# Patient Record
Sex: Female | Born: 1995
Health system: Southern US, Community
[De-identification: ages and names within clinical notes are randomized; demographics above are authoritative.]

## PROBLEM LIST (undated history)

## (undated) ENCOUNTER — Inpatient Hospital Stay (HOSPITAL_COMMUNITY): Payer: Self-pay

## (undated) DIAGNOSIS — L309 Dermatitis, unspecified: Secondary | ICD-10-CM

## (undated) DIAGNOSIS — F909 Attention-deficit hyperactivity disorder, unspecified type: Secondary | ICD-10-CM

## (undated) HISTORY — PX: NO PAST SURGERIES: SHX2092

## (undated) HISTORY — PX: INDUCED ABORTION: SHX677

---
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
## 2017-05-28 ENCOUNTER — Other Ambulatory Visit: Payer: Self-pay

## 2017-05-28 ENCOUNTER — Encounter (HOSPITAL_BASED_OUTPATIENT_CLINIC_OR_DEPARTMENT_OTHER): Payer: Self-pay | Admitting: Emergency Medicine

## 2017-05-28 ENCOUNTER — Emergency Department (HOSPITAL_BASED_OUTPATIENT_CLINIC_OR_DEPARTMENT_OTHER)
Admission: EM | Admit: 2017-05-28 | Discharge: 2017-05-28 | Disposition: A | Discharge status: Home / Self Care | Payer: 59 | Attending: Emergency Medicine | Admitting: Emergency Medicine

## 2017-05-28 ENCOUNTER — Emergency Department (HOSPITAL_BASED_OUTPATIENT_CLINIC_OR_DEPARTMENT_OTHER): Payer: 59

## 2017-05-28 DIAGNOSIS — S8992XA Unspecified injury of left lower leg, initial encounter: Secondary | ICD-10-CM | POA: Diagnosis not present

## 2017-05-28 DIAGNOSIS — Y9301 Activity, walking, marching and hiking: Secondary | ICD-10-CM | POA: Diagnosis not present

## 2017-05-28 DIAGNOSIS — Z79899 Other long term (current) drug therapy: Secondary | ICD-10-CM | POA: Insufficient documentation

## 2017-05-28 DIAGNOSIS — X509XXA Other and unspecified overexertion or strenuous movements or postures, initial encounter: Secondary | ICD-10-CM | POA: Diagnosis not present

## 2017-05-28 DIAGNOSIS — Y929 Unspecified place or not applicable: Secondary | ICD-10-CM | POA: Insufficient documentation

## 2017-05-28 DIAGNOSIS — S80912A Unspecified superficial injury of left knee, initial encounter: Secondary | ICD-10-CM | POA: Diagnosis present

## 2017-05-28 DIAGNOSIS — S83402A Sprain of unspecified collateral ligament of left knee, initial encounter: Secondary | ICD-10-CM | POA: Diagnosis not present

## 2017-05-28 DIAGNOSIS — Y999 Unspecified external cause status: Secondary | ICD-10-CM | POA: Insufficient documentation

## 2017-05-28 MED ORDER — IBUPROFEN 600 MG PO TABS: 600.0000 mg | ORAL_TABLET | Freq: Three times a day (TID) | ORAL | 0 refills | Status: DC | PRN

## 2017-05-28 MED ORDER — IBUPROFEN 800 MG PO TABS
800.0000 mg | ORAL_TABLET | Freq: Once | ORAL | Status: AC
  Administered 2017-05-28: 800 mg via ORAL
  Filled 2017-05-28: qty 1

## 2017-05-28 NOTE — ED Triage Notes (Signed)
Patient states that she is having pain to her left knee after a fall recently. The patient reports that swelling

## 2017-05-28 NOTE — ED Provider Notes (Signed)
[facility] EMERGENCY DEPARTMENT Provider Note   CSN: [id number] Arrival date & time: 05/28/17  1224     History   Chief Complaint Chief Complaint  Patient presents with  . Knee Pain    HPI Autumn Gibson is a 22 y.o. female.  HPI   22 year old female with history of ADHD presenting for evaluation of left knee injury.  Patient report today she Accidentally twisted her left knee while walking and experiencing pain to the back of her knee.  Pain is a sharp throbbing sensation worsening with she bending her knee.  She felt that her knee is not well aligned.  States that she injured the same knee several times in the past after a major fall and mentioned that has not been the same since.  Rates pain as 5 out of 10.  Pain does not radiate.  No hip or ankle pain.  She has been able to ambulate.  No new numbness.  No specific treatment tried.  Past Medical History:  Diagnosis Date  . ADHD (attention deficit hyperactivity disorder)     Patient Active Problem List   Diagnosis Date Noted  . SVD (spontaneous vaginal delivery) 10/01/2015  . Postpartum care following vaginal delivery 10/01/2015  . Term pregnancy 09/30/2015    Past Surgical History:  Procedure Laterality Date  . NO PAST SURGERIES      OB History    Gravida Para Term Preterm AB Living   1 1 1     1    SAB TAB Ectopic Multiple Live Births         0 1       Home Medications    Prior to Admission medications   Medication Sig Start Date End Date Taking? Authorizing Provider  ibuprofen (ADVIL,MOTRIN) 600 MG tablet Take 1 tablet (600 mg total) by mouth every 6 (six) hours. 10/03/15   Edwinna AreolaBanga, Cecilia Worema, DO  meloxicam (MOBIC) 15 MG tablet Take 1 tablet (15 mg total) by mouth daily. 09/26/16   Robinson, [place] N, PA-C  Prenatal Vit-Fe Fumarate-FA (PRENATAL MULTIVITAMIN) TABS tablet Take 1 tablet by mouth daily at 12 noon.    [provider]    Family History Family History  Problem Relation  Age of Onset  . Hypertension Father   . Hypertension Other     Social History Social History   Tobacco Use  . Smoking status: Never Smoker  . Smokeless tobacco: Never Used  Substance Use Topics  . Alcohol use: No  . Drug use: No     Allergies   Patient has no known allergies.   Review of Systems Review of Systems  Constitutional: Positive for fever.  Musculoskeletal: Positive for arthralgias.  Neurological: Negative for numbness.     Physical Exam Updated Vital Signs BP 108/75 (BP Location: Left Arm)   Pulse 77   Temp 98.4 F (36.9 C) (Oral)   Resp 18   Ht 5\' 3"  (1.6 m)   Wt 72.6 kg (160 lb)   LMP 05/26/2017   SpO2 100%   BMI 28.34 kg/m   Physical Exam  Constitutional: She appears well-developed and well-nourished. No distress.  HENT:  Head: Atraumatic.  Eyes: Conjunctivae are normal.  Neck: Neck supple.  Musculoskeletal: She exhibits tenderness (Left knee: Tenderness to posterior fossa on palpation.  Patella is located.  No swelling erythema, warmth or deformity noted.  No joint laxity.).  Left ankle and left hips are nontender  Neurological: She is alert.  Skin: No rash  noted.  Psychiatric: She has a normal mood and affect.  Nursing note and vitals reviewed.    ED Treatments / Results  Labs (all labs ordered are listed, but only abnormal results are displayed) Labs Reviewed - No data to display  EKG  EKG Interpretation None       Radiology Dg Knee Complete 4 Views Left  Result Date: 05/28/2017 CLINICAL DATA:  Larey Seat today with twisting injury. EXAM: LEFT KNEE - COMPLETE 4+ VIEW COMPARISON:  09/01/2016 FINDINGS: No visible joint effusion. No fracture or dislocation. No degenerative change or focal finding. IMPRESSION: Normal radiographs. Electronically Signed   By: Paulina Fusi M.D.   On: 05/28/2017 12:50    Procedures Procedures (including critical care time)  Medications Ordered in ED Medications - No data to display   Initial  Impression / Assessment and Plan / ED Course  I have reviewed the triage vital signs and the nursing notes.  Pertinent labs & imaging results that were available during my care of the patient were reviewed by me and considered in my medical decision making (see chart for details).     BP 108/75 (BP Location: Left Arm)   Pulse 77   Temp 98.4 F (36.9 C) (Oral)   Resp 18   Ht 5\' 3"  (1.6 m)   Wt 72.6 kg (160 lb)   LMP 05/26/2017   SpO2 100%   BMI 28.34 kg/m    Final Clinical Impressions(s) / ED Diagnoses   Final diagnoses:  Sprain of collateral ligament of left knee, initial encounter    ED Discharge Orders        Ordered    ibuprofen (ADVIL,MOTRIN) 600 MG tablet  Every 8 hours PRN     05/28/17 1412     2:12 PM Patient injured her left knee from a A mechanical fall.  X-ray of left knee without acute fractures or dislocation.  This is likely a knee sprain.  Will provide knee sleeve for support.  Rice therapy discussed.  Orthopedic referral given given as needed.   Fayrene Helper, PA-C 05/28/17 1413    Raeford Razor, MD 05/28/17 [id number]

## 2017-09-01 ENCOUNTER — Emergency Department (HOSPITAL_BASED_OUTPATIENT_CLINIC_OR_DEPARTMENT_OTHER)
Admission: EM | Admit: 2017-09-01 | Discharge: 2017-09-01 | Disposition: A | Discharge status: Home / Self Care | Payer: 59 | Attending: Emergency Medicine | Admitting: Emergency Medicine

## 2017-09-01 ENCOUNTER — Other Ambulatory Visit: Payer: Self-pay

## 2017-09-01 ENCOUNTER — Encounter (HOSPITAL_BASED_OUTPATIENT_CLINIC_OR_DEPARTMENT_OTHER): Payer: Self-pay | Admitting: Emergency Medicine

## 2017-09-01 DIAGNOSIS — Z79899 Other long term (current) drug therapy: Secondary | ICD-10-CM | POA: Insufficient documentation

## 2017-09-01 DIAGNOSIS — H9201 Otalgia, right ear: Secondary | ICD-10-CM | POA: Diagnosis present

## 2017-09-01 DIAGNOSIS — H6501 Acute serous otitis media, right ear: Secondary | ICD-10-CM | POA: Diagnosis not present

## 2017-09-01 DIAGNOSIS — H66001 Acute suppurative otitis media without spontaneous rupture of ear drum, right ear: Secondary | ICD-10-CM | POA: Diagnosis not present

## 2017-09-01 DIAGNOSIS — F909 Attention-deficit hyperactivity disorder, unspecified type: Secondary | ICD-10-CM | POA: Insufficient documentation

## 2017-09-01 MED ORDER — IBUPROFEN 400 MG PO TABS
600.0000 mg | ORAL_TABLET | Freq: Once | ORAL | Status: AC
  Administered 2017-09-01: 600 mg via ORAL
  Filled 2017-09-01: qty 1

## 2017-09-01 MED ORDER — AMOXICILLIN 500 MG PO CAPS: 500.0000 mg | ORAL_CAPSULE | Freq: Three times a day (TID) | ORAL | 0 refills | Status: AC

## 2017-09-01 MED ORDER — OXYCODONE-ACETAMINOPHEN 5-325 MG PO TABS
1.0000 | ORAL_TABLET | Freq: Once | ORAL | Status: AC
  Administered 2017-09-01: 1 via ORAL
  Filled 2017-09-01: qty 1

## 2017-09-01 MED ORDER — NAPROXEN 500 MG PO TABS: 500.0000 mg | ORAL_TABLET | Freq: Two times a day (BID) | ORAL | 0 refills | Status: DC | PRN

## 2017-09-01 MED FILL — AMOXICILLIN 500 MG CAPSULE: 500 | 7 days supply | Qty: 21 | Fill #0

## 2017-09-01 MED FILL — NAPROXEN 500 MG TABLET: 500 | 10 days supply | Qty: 20 | Fill #0

## 2017-09-01 NOTE — ED Notes (Signed)
ED Provider at bedside. 

## 2017-09-01 NOTE — ED Triage Notes (Signed)
Severe right ear pain since 3am.  Sore throat and sinus congestion for past few days.

## 2017-09-02 NOTE — ED Provider Notes (Signed)
[facility] EMERGENCY DEPARTMENT Provider Note   CSN: [id number] Arrival date & time: 09/01/17  0908     History   Chief Complaint Chief Complaint  Patient presents with  . Otalgia    HPI Autumn Gibson is a 22 y.o. female.  HPI  22 year old female with right ear pain.  Acute onset around 2 AM this morning.  Pain is sharp and persistent since then.  No change in hearing.  No drainage.  No fevers or chills.  No other acute complaints.  Past Medical History:  Diagnosis Date  . ADHD (attention deficit hyperactivity disorder)     Patient Active Problem List   Diagnosis Date Noted  . SVD (spontaneous vaginal delivery) 10/01/2015  . Postpartum care following vaginal delivery 10/01/2015  . Term pregnancy 09/30/2015    Past Surgical History:  Procedure Laterality Date  . NO PAST SURGERIES       OB History    Gravida  1   Para  1   Term  1   Preterm      AB      Living  1     SAB      TAB      Ectopic      Multiple  0   Live Births  1            Home Medications    Prior to Admission medications   Medication Sig Start Date End Date Taking? Authorizing Provider  amoxicillin (AMOXIL) 500 MG capsule Take 1 capsule (500 mg total) by mouth 3 (three) times daily. 09/01/17   Raeford RazorKohut, Dayden Viverette, MD  ibuprofen (ADVIL,MOTRIN) 600 MG tablet Take 1 tablet (600 mg total) by mouth every 8 (eight) hours as needed for mild pain or moderate pain. 05/28/17   Fayrene Helperran, Bow[place], PA-C  meloxicam (MOBIC) 15 MG tablet Take 1 tablet (15 mg total) by mouth daily. 09/26/16   Robinson, [place] N, PA-C  naproxen (NAPROSYN) 500 MG tablet Take 1 tablet (500 mg total) by mouth 2 (two) times daily as needed. 09/01/17   Raeford RazorKohut, Herbie Lehrmann, MD  Prenatal Vit-Fe Fumarate-FA (PRENATAL MULTIVITAMIN) TABS tablet Take 1 tablet by mouth daily at 12 noon.    [provider]    Family History Family History  Problem Relation Age of Onset  . Hypertension Father   . Hypertension Other       Social History Social History   Tobacco Use  . Smoking status: Never Smoker  . Smokeless tobacco: Never Used  Substance Use Topics  . Alcohol use: No  . Drug use: No     Allergies   Patient has no known allergies.   Review of Systems Review of Systems   All systems reviewed and negative, other than as noted in HPI.   Physical Exam Updated Vital Signs BP (!) 125/93 (BP Location: Right Arm)   Pulse 74   Temp 98 F (36.7 C) (Oral)   Resp 16   Ht 5\' 4"  (1.626 m)   Wt 81.6 kg (180 lb)   LMP 08/17/2017   SpO2 100%   BMI 30.90 kg/m   Physical Exam  Constitutional: She appears well-developed and well-nourished. No distress.  HENT:  Head: Normocephalic and atraumatic.  Right external auditory canal and external ear is normal in appearance.  Right tympanic membrane is bulging, dull, erythematous and bony landmarks are indistinct.  Eyes: Conjunctivae are normal. Right eye exhibits no discharge. Left eye exhibits no discharge.  Neck: Neck supple.  Cardiovascular: Normal rate, regular rhythm and normal heart sounds. Exam reveals no gallop and no friction rub.  No murmur heard. Pulmonary/Chest: Effort normal and breath sounds normal. No respiratory distress.  Abdominal: Soft. She exhibits no distension. There is no tenderness.  Musculoskeletal: She exhibits no edema or tenderness.  Neurological: She is alert.  Skin: Skin is warm and dry.  Psychiatric: She has a normal mood and affect. Her behavior is normal. Thought content normal.  Nursing note and vitals reviewed.    ED Treatments / Results  Labs (all labs ordered are listed, but only abnormal results are displayed) Labs Reviewed - No data to display  EKG None  Radiology No results found.  Procedures Procedures (including critical care time)  Medications Ordered in ED Medications  ibuprofen (ADVIL,MOTRIN) tablet 600 mg (600 mg Oral Given 09/01/17 0940)  oxyCODONE-acetaminophen (PERCOCET/ROXICET) 5-325  MG per tablet 1 tablet (1 tablet Oral Given 09/01/17 0940)     Initial Impression / Assessment and Plan / ED Course  I have reviewed the triage vital signs and the nursing notes.  Pertinent labs & imaging results that were available during my care of the patient were reviewed by me and considered in my medical decision making (see chart for details).     22 year old female with right ear pain.  Otitis media on exam.  Antibiotics.  Return precautions discussed.  Outpatient follow-up as needed otherwise.  As needed NSAIDs for pain.  Final Clinical Impressions(s) / ED Diagnoses   Final diagnoses:  Non-recurrent acute suppurative otitis media of right ear without spontaneous rupture of tympanic membrane    ED Discharge Orders        Ordered    amoxicillin (AMOXIL) 500 MG capsule  3 times daily     09/01/17 0928    naproxen (NAPROSYN) 500 MG tablet  2 times daily PRN     09/01/17 1610       Raeford Razor, MD 09/02/17 1005

## 2018-02-06 ENCOUNTER — Emergency Department (HOSPITAL_BASED_OUTPATIENT_CLINIC_OR_DEPARTMENT_OTHER)
Admission: EM | Admit: 2018-02-06 | Discharge: 2018-02-06 | Disposition: A | Discharge status: Home / Self Care | Payer: 59 | Attending: Emergency Medicine | Admitting: Emergency Medicine

## 2018-02-06 ENCOUNTER — Other Ambulatory Visit: Payer: Self-pay

## 2018-02-06 ENCOUNTER — Encounter (HOSPITAL_BASED_OUTPATIENT_CLINIC_OR_DEPARTMENT_OTHER): Payer: Self-pay

## 2018-02-06 DIAGNOSIS — L309 Dermatitis, unspecified: Secondary | ICD-10-CM | POA: Diagnosis not present

## 2018-02-06 DIAGNOSIS — Z79899 Other long term (current) drug therapy: Secondary | ICD-10-CM | POA: Diagnosis not present

## 2018-02-06 DIAGNOSIS — R21 Rash and other nonspecific skin eruption: Secondary | ICD-10-CM | POA: Diagnosis present

## 2018-02-06 HISTORY — DX: Dermatitis, unspecified: L30.9

## 2018-02-06 MED ORDER — PREDNISONE 50 MG PO TABS
60.0000 mg | ORAL_TABLET | Freq: Once | ORAL | Status: AC
  Administered 2018-02-06: 60 mg via ORAL
  Filled 2018-02-06: qty 1

## 2018-02-06 MED ORDER — DIPHENHYDRAMINE HCL 25 MG PO CAPS
25.0000 mg | ORAL_CAPSULE | Freq: Once | ORAL | Status: AC
  Administered 2018-02-06: 25 mg via ORAL
  Filled 2018-02-06: qty 1

## 2018-02-06 MED ORDER — PREDNISONE 50 MG PO TABS: 50.0000 mg | ORAL_TABLET | Freq: Every day | ORAL | 0 refills | Status: AC

## 2018-02-06 MED ORDER — FAMOTIDINE 20 MG PO TABS
20.0000 mg | ORAL_TABLET | Freq: Once | ORAL | Status: AC
  Administered 2018-02-06: 20 mg via ORAL
  Filled 2018-02-06: qty 1

## 2018-02-06 NOTE — ED Triage Notes (Signed)
C/o scattered rash x 2 days-NAD-steady gait 

## 2018-02-06 NOTE — Discharge Instructions (Signed)
This is likely a contact dermatitis.  You may use cool compresses over the area but avoid putting any creams over it, try and avoid scratching as much as possible.  Take prednisone for the next 5 days as directed, Benadryl and Pepcid as needed for itching.  If not improving please follow-up with your primary care doctor.  If you develop fevers or chills, pain around the rash or increasing redness, puslike drainage from the area or any other new or concerning symptoms return to the emergency department.

## 2018-02-06 NOTE — ED Provider Notes (Signed)
[facility] EMERGENCY DEPARTMENT Provider Note   CSN: [id number] Arrival date & time: 02/06/18  1731     History   Chief Complaint Chief Complaint  Patient presents with  . Rash    HPI Autumn Gibson is a 22 y.o. female.  Autumn Gibson is a 22 y.o. Female history of eczema and ADHD, who presents to the emergency department for evaluation of extremely pruritic rash noted over her abdomen, she also reports small patches on her legs and arms.  She reports this started about 2 days ago and has gotten progressively worse.  She reports the rash is extremely itchy and she has been scratching especially over her abdomen and caused a few breaks in her skin.  She denies any associated fevers or chills, no nausea or vomiting, no abdominal pain.  No associated facial swelling, swelling of the lips or tongue, no shortness of breath or throat closing sensation.  She denies any new household products, medications or foods.  She does report history of eczema and reports this feels like a worse version of that.  She has not taken anything prior to arrival or tried any creams to try and treat rash.  No other aggravating or alleviating factors.     Past Medical History:  Diagnosis Date  . ADHD (attention deficit hyperactivity disorder)   . Eczema     Patient Active Problem List   Diagnosis Date Noted  . SVD (spontaneous vaginal delivery) 10/01/2015  . Postpartum care following vaginal delivery 10/01/2015  . Term pregnancy 09/30/2015    Past Surgical History:  Procedure Laterality Date  . NO PAST SURGERIES       OB History    Gravida  1   Para  1   Term  1   Preterm      AB      Living  1     SAB      TAB      Ectopic      Multiple  0   Live Births  1            Home Medications    Prior to Admission medications   Medication Sig Start Date End Date Taking? Authorizing Provider  amoxicillin (AMOXIL) 500 MG capsule Take 1 capsule (500 mg total)  by mouth 3 (three) times daily. 09/01/17   Raeford Razor, MD  ibuprofen (ADVIL,MOTRIN) 600 MG tablet Take 1 tablet (600 mg total) by mouth every 8 (eight) hours as needed for mild pain or moderate pain. 05/28/17   Fayrene Helper, PA-C  meloxicam (MOBIC) 15 MG tablet Take 1 tablet (15 mg total) by mouth daily. 09/26/16   Robinson, [place] N, PA-C  naproxen (NAPROSYN) 500 MG tablet Take 1 tablet (500 mg total) by mouth 2 (two) times daily as needed. 09/01/17   Raeford Razor, MD  Prenatal Vit-Fe Fumarate-FA (PRENATAL MULTIVITAMIN) TABS tablet Take 1 tablet by mouth daily at 12 noon.    [provider]    Family History Family History  Problem Relation Age of Onset  . Hypertension Father   . Hypertension Other     Social History Social History   Tobacco Use  . Smoking status: Never Smoker  . Smokeless tobacco: Never Used  Substance Use Topics  . Alcohol use: No  . Drug use: No     Allergies   Patient has no known allergies.   Review of Systems Review of Systems  Constitutional: Negative for chills and fever.  HENT: Negative for drooling, facial swelling, sore throat, trouble swallowing and voice change.   Respiratory: Negative for chest tightness, shortness of breath, wheezing and stridor.   Gastrointestinal: Negative for abdominal pain, nausea and vomiting.  Skin: Positive for rash.     Physical Exam Updated Vital Signs BP (!) 128/101 (BP Location: Left Arm)   Pulse 95   Temp 98.1 F (36.7 C) (Oral)   Resp 18   Ht 5\' 3"  (1.6 m)   Wt 80.7 kg   LMP 02/04/2018   SpO2 100%   BMI 31.53 kg/m   Physical Exam  Constitutional: She appears well-developed and well-nourished. No distress.  HENT:  Head: Normocephalic and atraumatic.  Mouth/Throat: Oropharynx is clear and moist.  No facial swelling, no swelling of the lips or tongue, posterior oropharynx is clear.  Eyes: Right eye exhibits no discharge. Left eye exhibits no discharge.  Neck: Neck supple.  Cardiovascular:  Normal rate, regular rhythm, normal heart sounds and intact distal pulses.  Pulmonary/Chest: Effort normal and breath sounds normal. No respiratory distress.  Respirations equal and unlabored, patient able to speak in full sentences, lungs clear to auscultation bilaterally  Abdominal: Soft. Bowel sounds are normal. She exhibits no distension and no mass. There is no tenderness. There is no guarding.  Raised pruritic rash with few breaks in the skin noted over the central abdomen (please see photo below), abdomen is soft, nondistended, bowel sounds present throughout, nontender to palpation throughout  Neurological: She is alert. Coordination normal.  Skin: Skin is warm and dry. She is not diaphoretic.  Raised rash of the abdomen with a few scattered patches noted on the lower extremities as well, no vesicles, no pustules, no petechiae, no surrounding erythema, no purulent drainage, no evidence of cellulitis  Psychiatric: She has a normal mood and affect. Her behavior is normal.  Nursing note and vitals reviewed.      ED Treatments / Results  Labs (all labs ordered are listed, but only abnormal results are displayed) Labs Reviewed - No data to display  EKG None  Radiology No results found.  Procedures Procedures (including critical care time)  Medications Ordered in ED Medications - No data to display   Initial Impression / Assessment and Plan / ED Course  I have reviewed the triage vital signs and the nursing notes.  Pertinent labs & imaging results that were available during my care of the patient were reviewed by me and considered in my medical decision making (see chart for details).  Rash consistent with dermatitis. Patient denies any difficulty breathing or swallowing.  Pt has a patent airway without stridor and is handling secretions without difficulty; no angioedema. No blisters, no pustules, no warmth, no draining sinus tracts, no superficial abscesses, no bullous  impetigo, no vesicles, no desquamation, no target lesions with dusky purpura or a central bulla. Not tender to touch. No concern for superimposed infection. No concern for SJS, TEN, TSS, tick borne illness, syphilis or other life-threatening condition. Will discharge home with short course of steroids, pepcid and recommend Benadryl and pepcid as needed for pruritis.  Return precautions discussed, patient to follow-up with your PCP on Monday if rash not improving.  She expresses understanding and is in agreement with this plan.  Stable for discharge home at this time.    Final Clinical Impressions(s) / ED Diagnoses   Final diagnoses:  Dermatitis    ED Discharge Orders    None       Dartha Lodge, [place]  02/06/18 1827    Maia PlanLong, Joshua G, MD 02/07/18 1012

## 2018-03-29 IMAGING — CR DG ANKLE COMPLETE 3+V*L*
3 series · 3 of 3 positions shown · non-contrast
Comparison: None.

CLINICAL DATA: Pain after fall.

EXAM:
LEFT ANKLE COMPLETE - 3+ VIEW

[x ankle ap left]
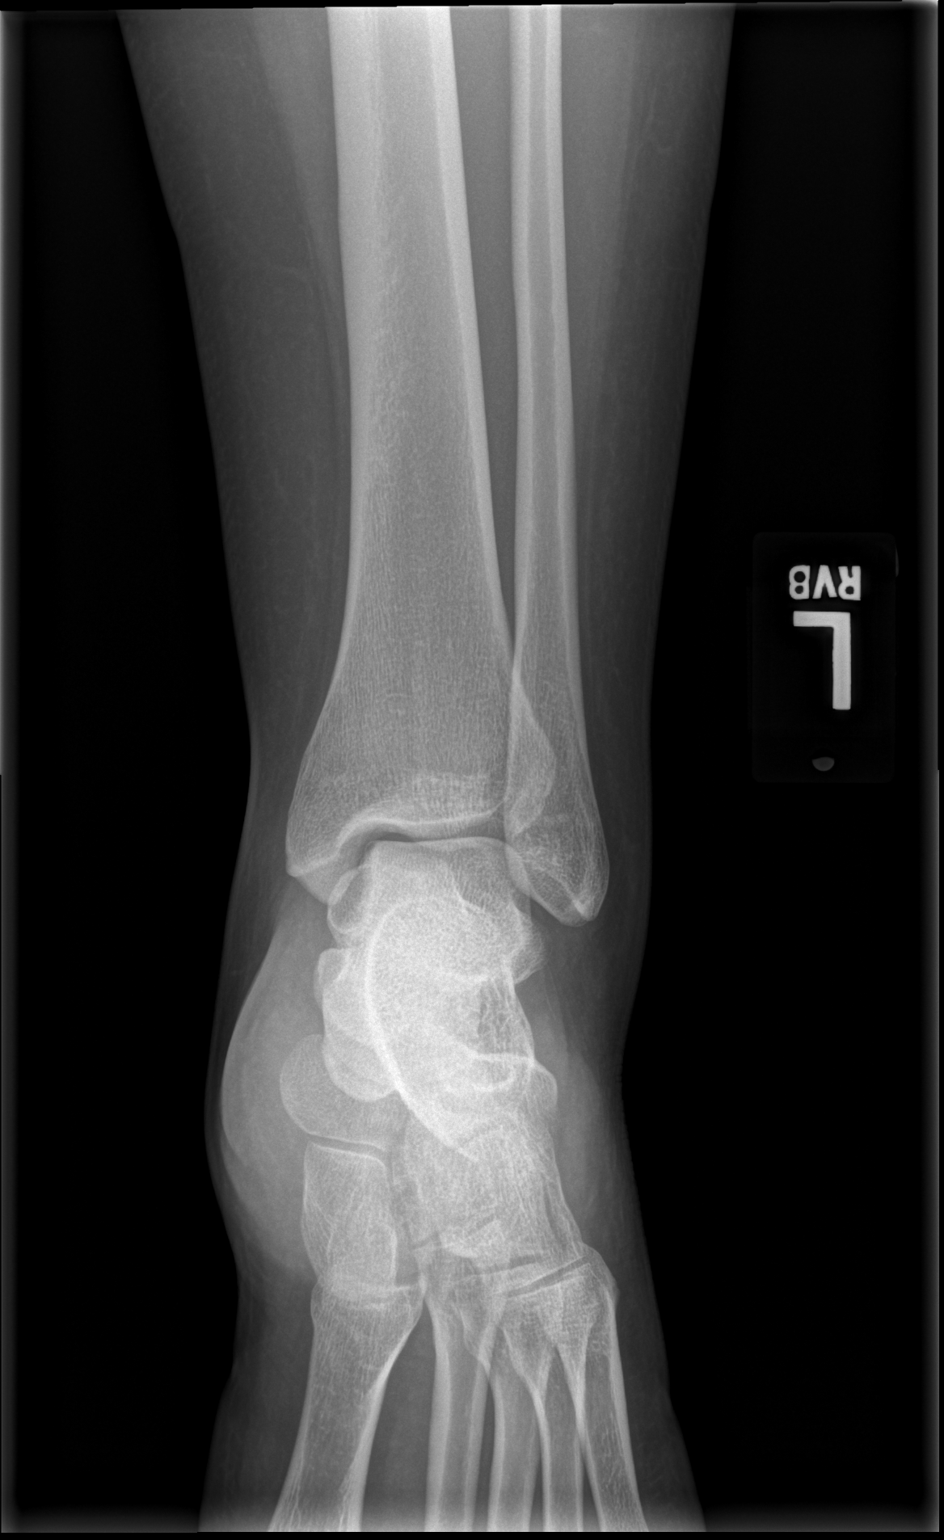

[x ankle obl left]
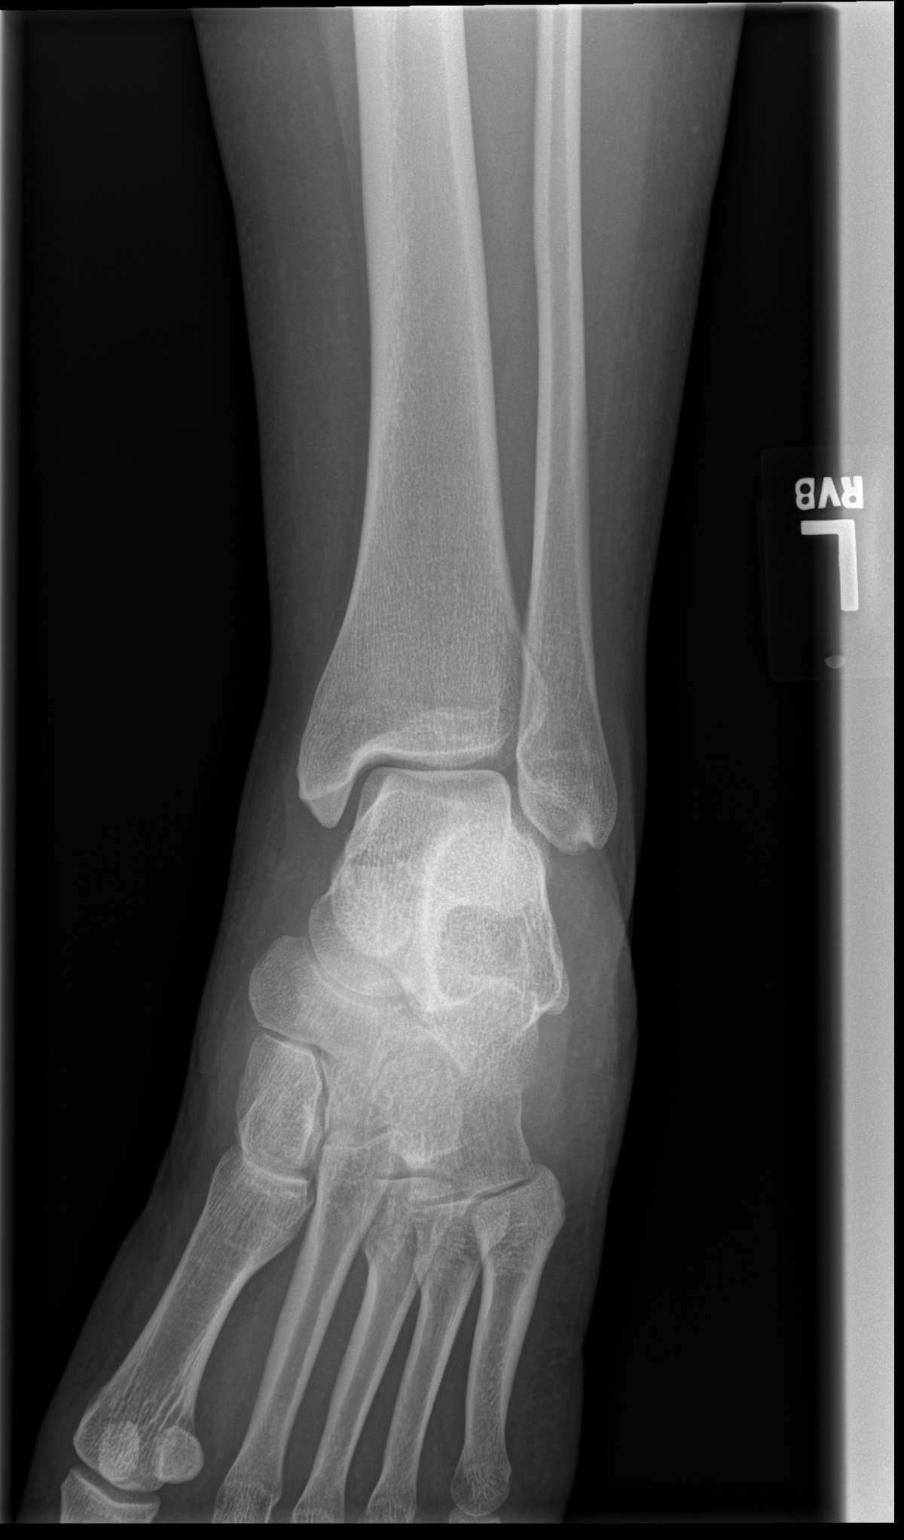

[x ankle lat left]
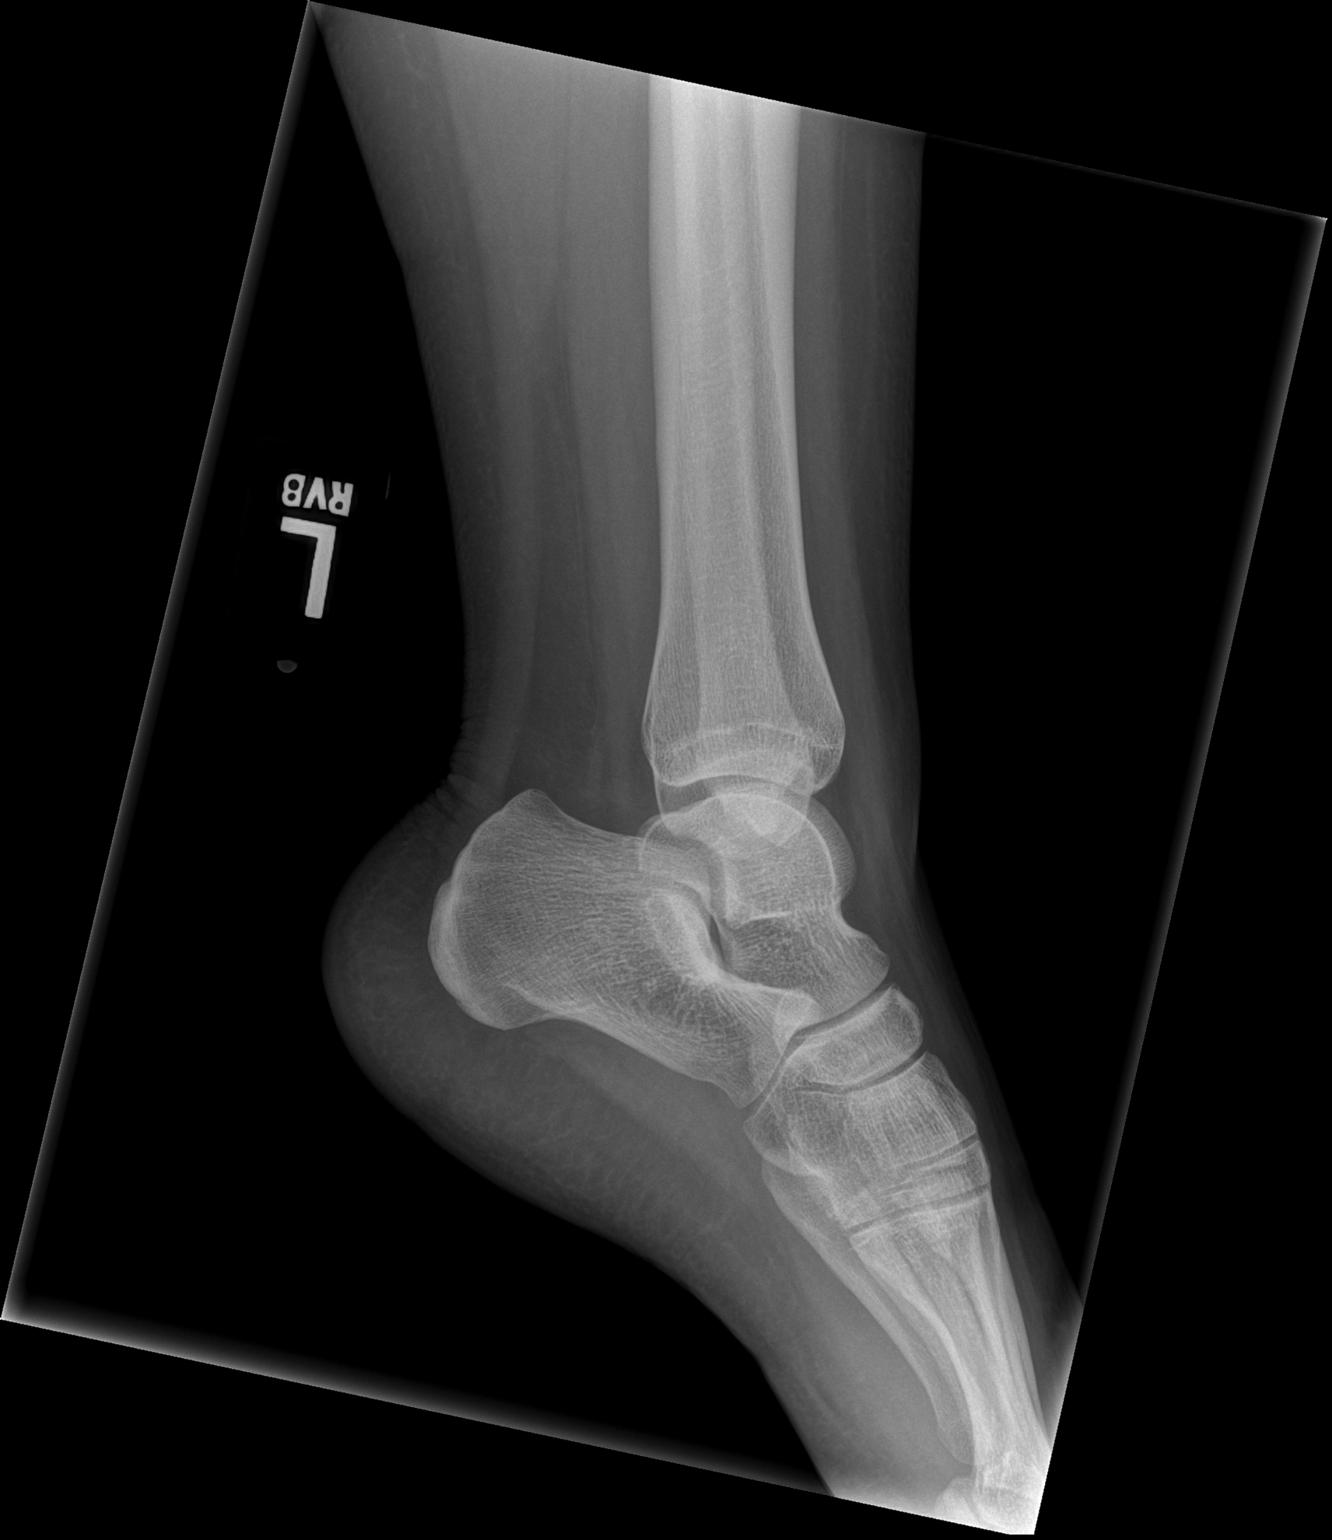

[3 of 3 positions shown; findings below may reference images not displayed]

FINDINGS: There is no evidence of fracture, dislocation, or joint effusion.
There is no evidence of arthropathy or other focal bone abnormality.
Soft tissues are unremarkable.
IMPRESSION: Negative.

## 2019-01-18 DIAGNOSIS — Z3046 Encounter for surveillance of implantable subdermal contraceptive: Secondary | ICD-10-CM | POA: Diagnosis not present

## 2019-01-18 DIAGNOSIS — Z113 Encounter for screening for infections with a predominantly sexual mode of transmission: Secondary | ICD-10-CM | POA: Diagnosis not present

## 2019-01-18 DIAGNOSIS — Z01419 Encounter for gynecological examination (general) (routine) without abnormal findings: Secondary | ICD-10-CM | POA: Diagnosis not present

## 2019-01-18 DIAGNOSIS — Z309 Encounter for contraceptive management, unspecified: Secondary | ICD-10-CM | POA: Diagnosis not present

## 2020-01-06 ENCOUNTER — Encounter (HOSPITAL_BASED_OUTPATIENT_CLINIC_OR_DEPARTMENT_OTHER): Payer: Self-pay

## 2020-01-06 ENCOUNTER — Other Ambulatory Visit: Payer: Self-pay

## 2020-01-06 ENCOUNTER — Emergency Department (HOSPITAL_BASED_OUTPATIENT_CLINIC_OR_DEPARTMENT_OTHER)
Admission: EM | Admit: 2020-01-06 | Discharge: 2020-01-06 | Disposition: A | Discharge status: Home / Self Care | Payer: 59 | Attending: Emergency Medicine | Admitting: Emergency Medicine

## 2020-01-06 DIAGNOSIS — B9689 Other specified bacterial agents as the cause of diseases classified elsewhere: Secondary | ICD-10-CM | POA: Diagnosis not present

## 2020-01-06 DIAGNOSIS — Z711 Person with feared health complaint in whom no diagnosis is made: Secondary | ICD-10-CM

## 2020-01-06 DIAGNOSIS — F909 Attention-deficit hyperactivity disorder, unspecified type: Secondary | ICD-10-CM | POA: Diagnosis not present

## 2020-01-06 DIAGNOSIS — Z202 Contact with and (suspected) exposure to infections with a predominantly sexual mode of transmission: Secondary | ICD-10-CM | POA: Insufficient documentation

## 2020-01-06 DIAGNOSIS — N760 Acute vaginitis: Secondary | ICD-10-CM | POA: Diagnosis not present

## 2020-01-06 LAB — WET PREP, GENITAL
Sperm: NONE SEEN
Trich, Wet Prep: NONE SEEN
Yeast Wet Prep HPF POC: NONE SEEN

## 2020-01-06 LAB — URINALYSIS, ROUTINE W REFLEX MICROSCOPIC
Bilirubin Urine: NEGATIVE
Glucose, UA: NEGATIVE mg/dL
Hgb urine dipstick: NEGATIVE
Ketones, ur: NEGATIVE mg/dL
Leukocytes,Ua: NEGATIVE
Nitrite: NEGATIVE
Protein, ur: NEGATIVE mg/dL
Specific Gravity, Urine: >1.03 — ABNORMAL HIGH (ref 1.005–1.030)
pH: 6 (ref 5.0–8.0)

## 2020-01-06 LAB — PREGNANCY, URINE: Preg Test, Ur: NEGATIVE

## 2020-01-06 MED ORDER — CEFTRIAXONE SODIUM 500 MG IJ SOLR
500.0000 mg | Freq: Once | INTRAMUSCULAR | Status: AC
  Administered 2020-01-06: 500 mg via INTRAMUSCULAR
  Filled 2020-01-06: qty 500

## 2020-01-06 MED ORDER — DOXYCYCLINE HYCLATE 100 MG PO CAPS: 100.0000 mg | ORAL_CAPSULE | Freq: Two times a day (BID) | ORAL | 0 refills | Status: AC

## 2020-01-06 MED ORDER — METRONIDAZOLE 500 MG PO TABS: 500.0000 mg | ORAL_TABLET | Freq: Two times a day (BID) | ORAL | 0 refills | Status: AC

## 2020-01-06 MED ORDER — LIDOCAINE HCL (PF) 1 % IJ SOLN
INTRAMUSCULAR | Status: AC
  Administered 2020-01-06: 5 mL
  Filled 2020-01-06: qty 5

## 2020-01-06 MED FILL — METRONIDAZOLE 500 MG TABS: 500 | 7 days supply | Qty: 14 | Fill #0

## 2020-01-06 MED FILL — DOXYCYCLINE HYCLATE 100 MG: 100 | 7 days supply | Qty: 14 | Fill #0

## 2020-01-06 NOTE — Discharge Instructions (Addendum)
I prescribed you 2 antibiotics.  The first is called metronidazole and the second is called doxycycline.  You are going to take these twice a day for the next 7 days.  Please do not stop taking these medications early.  Please return to the emergency department with new or worsening symptoms.  Please follow-up with your primary care provider in 1 week.  It was a pleasure to meet you.

## 2020-01-06 NOTE — ED Triage Notes (Signed)
Pt arrives with c/o vaginal d/c X2 weeks states she would also like to be tested for STD.

## 2020-01-06 NOTE — ED Provider Notes (Signed)
[facility] EMERGENCY DEPARTMENT Provider Note   CSN: [id number] Arrival date & time: 01/06/20  0825     History Chief Complaint  Patient presents with  . Vaginal Discharge    Autumn Gibson is a 24 y.o. female.  HPI Patient is a 24 year old female with a medical history as noted below.  Patient states about 2 weeks ago she started experiencing increased clear vaginal discharge.  She states that she and her boyfriend for the past 6 months recently broke up and she is concerned that he might have been sexually active with other females in addition to her.  She has no other complaints at this time.  No fever, chills, chest pain, shortness of breath, abdominal pain, nausea, vomiting, diarrhea, dysuria, hematuria.    Past Medical History:  Diagnosis Date  . ADHD (attention deficit hyperactivity disorder)   . Eczema     Patient Active Problem List   Diagnosis Date Noted  . SVD (spontaneous vaginal delivery) 10/01/2015  . Postpartum care following vaginal delivery 10/01/2015  . Term pregnancy 09/30/2015    Past Surgical History:  Procedure Laterality Date  . NO PAST SURGERIES       OB History    Gravida  1   Para  1   Term  1   Preterm      AB      Living  1     SAB      TAB      Ectopic      Multiple  0   Live Births  1           Family History  Problem Relation Age of Onset  . Hypertension Father   . Hypertension Other     Social History   Tobacco Use  . Smoking status: Never Smoker  . Smokeless tobacco: Never Used  Substance Use Topics  . Alcohol use: No  . Drug use: No    Home Medications Prior to Admission medications   Medication Sig Start Date End Date Taking? Authorizing Provider  amoxicillin (AMOXIL) 500 MG capsule Take 1 capsule (500 mg total) by mouth 3 (three) times daily. 09/01/17   Raeford Razor, MD  ibuprofen (ADVIL,MOTRIN) 600 MG tablet Take 1 tablet (600 mg total) by mouth every 8 (eight) hours as needed  for mild pain or moderate pain. 05/28/17   Fayrene Helper, PA-C  meloxicam (MOBIC) 15 MG tablet Take 1 tablet (15 mg total) by mouth daily. 09/26/16   Robinson, [place] N, PA-C  naproxen (NAPROSYN) 500 MG tablet Take 1 tablet (500 mg total) by mouth 2 (two) times daily as needed. 09/01/17   Raeford Razor, MD  Prenatal Vit-Fe Fumarate-FA (PRENATAL MULTIVITAMIN) TABS tablet Take 1 tablet by mouth daily at 12 noon.    [provider]    Allergies    Patient has no known allergies.  Review of Systems   Review of Systems  All other systems reviewed and are negative. Ten systems reviewed and are negative for acute change, except as noted in the HPI.    Physical Exam Updated Vital Signs BP 122/81 (BP Location: Right Arm)   Pulse 77   Temp 98.7 F (37.1 C) (Oral)   Resp 16   Ht 5\' 3"  (1.6 m)   Wt 75.8 kg   LMP 12/20/2019   SpO2 100%   BMI 29.58 kg/m   Physical Exam Vitals and nursing note reviewed.  Constitutional:      General: She is  not in acute distress.    Appearance: She is well-developed.  HENT:     Head: Normocephalic and atraumatic.     Right Ear: External ear normal.     Left Ear: External ear normal.  Eyes:     General: No scleral icterus.       Right eye: No discharge.        Left eye: No discharge.     Conjunctiva/sclera: Conjunctivae normal.  Neck:     Trachea: No tracheal deviation.  Cardiovascular:     Rate and Rhythm: Normal rate.  Pulmonary:     Effort: Pulmonary effort is normal. No respiratory distress.     Breath sounds: No stridor.  Abdominal:     General: There is no distension.  Genitourinary:    Comments: Female nursing chaperone present.  Normal-appearing vulvar anatomy.  Moderate amount of white discharge noted in the cervical vault.  Closed cervical os.  No CMT.  No adnexal tenderness. Musculoskeletal:        General: No swelling or deformity.     Cervical back: Neck supple.  Skin:    General: Skin is warm and dry.     Findings: No rash.    Neurological:     Mental Status: She is alert.     Cranial Nerves: Cranial nerve deficit: no gross deficits.    ED Results / Procedures / Treatments   Labs (all labs ordered are listed, but only abnormal results are displayed) Labs Reviewed  WET PREP, GENITAL - Abnormal; Notable for the following components:      Result Value   Clue Cells Wet Prep HPF POC PRESENT (*)    WBC, Wet Prep HPF POC MANY (*)    All other components within normal limits  URINALYSIS, ROUTINE W REFLEX MICROSCOPIC - Abnormal; Notable for the following components:   APPearance CLOUDY (*)    Specific Gravity, Urine >1.030 (*)    All other components within normal limits  PREGNANCY, URINE  GC/CHLAMYDIA PROBE AMP ([place]) NOT AT [facility]    EKG None  Radiology No results found.  Procedures Procedures   Medications Ordered in ED Medications  cefTRIAXone (ROCEPHIN) injection 500 mg (has no administration in time range)    ED Course  I have reviewed the triage vital signs and the nursing notes.  Pertinent labs & imaging results that were available during my care of the patient were reviewed by me and considered in my medical decision making (see chart for details).  Clinical Course as of Jan 05 1213  Thu Jan 06, 2020  1041 Preg Test, Ur: NEGATIVE [LJ]  1214 Clue Cells Wet Prep HPF POC(!): PRESENT [LJ]  1214 WBC, Wet Prep HPF POC(!): MANY [LJ]    Clinical Course User Index [LJ] Placido Sou, PA-C   MDM Rules/Calculators/A&P                          Pt is a 24 y.o. female that present with a history, physical exam, ED Clinical Course as noted above.   Patient presents today for STD screening.  Positive clue cells and white blood cells on wet prep.  Discussed treating gonorrhea and chlamydia prophylactically and patient is amenable.  Her pregnancy test is negative.  Will give a dose of Rocephin here in the emergency department.  Will discharge on 1 week of doxycycline as well as  Flagyl.  Recommended the patient abstain from sex for the next 2 weeks.  She understands to check the results of her gonorrhea and Chlamydia testing and if they are positive she needs to discuss with her previous sexual partner.  She understands she can return to the ED for new or worsening symptoms.  Recommended that she follow-up with her primary care provider.  Her questions were answered and she was amicable at the time of discharge.  Her vital signs are stable.  Patient discharged to home/self care.  Condition at discharge: Stable  Note: Portions of this report may have been transcribed using voice recognition software. Every effort was made to ensure accuracy; however, inadvertent computerized transcription errors may be present.    Final Clinical Impression(s) / ED Diagnoses Final diagnoses:  BV (bacterial vaginosis)  Concern about STD in female without diagnosis   Rx / DC Orders ED Discharge Orders         Ordered    doxycycline (VIBRAMYCIN) 100 MG capsule  2 times daily     Discontinue  Reprint     01/06/20 1342    metroNIDAZOLE (FLAGYL) 500 MG tablet  2 times daily     Discontinue  Reprint     01/06/20 1342           Placido Sou, PA-C 01/06/20 1346    Arby Barrette, MD 01/18/20 1524

## 2020-01-07 LAB — GC/CHLAMYDIA PROBE AMP (~~LOC~~) NOT AT ARMC
Chlamydia: NEGATIVE
Comment: NEGATIVE
Comment: NORMAL
Neisseria Gonorrhea: NEGATIVE

## 2020-05-25 ENCOUNTER — Other Ambulatory Visit: Payer: Self-pay

## 2020-05-25 ENCOUNTER — Encounter (HOSPITAL_BASED_OUTPATIENT_CLINIC_OR_DEPARTMENT_OTHER): Payer: Self-pay | Admitting: *Deleted

## 2020-05-25 ENCOUNTER — Emergency Department (HOSPITAL_BASED_OUTPATIENT_CLINIC_OR_DEPARTMENT_OTHER)
Admission: EM | Admit: 2020-05-25 | Discharge: 2020-05-25 | Disposition: A | Discharge status: Home / Self Care | Payer: 59 | Attending: Emergency Medicine | Admitting: Emergency Medicine

## 2020-05-25 DIAGNOSIS — N898 Other specified noninflammatory disorders of vagina: Secondary | ICD-10-CM | POA: Insufficient documentation

## 2020-05-25 DIAGNOSIS — Z5321 Procedure and treatment not carried out due to patient leaving prior to being seen by health care provider: Secondary | ICD-10-CM | POA: Diagnosis not present

## 2020-05-25 LAB — URINALYSIS, ROUTINE W REFLEX MICROSCOPIC
Bilirubin Urine: NEGATIVE
Glucose, UA: NEGATIVE mg/dL
Hgb urine dipstick: NEGATIVE
Ketones, ur: 80 mg/dL — AB
Leukocytes,Ua: NEGATIVE
Nitrite: NEGATIVE
Protein, ur: NEGATIVE mg/dL
Specific Gravity, Urine: 1.02 (ref 1.005–1.030)
pH: 7.5 (ref 5.0–8.0)

## 2020-05-25 LAB — PREGNANCY, URINE: Preg Test, Ur: NEGATIVE

## 2020-05-25 NOTE — ED Notes (Signed)
Per registration, pt leaving at this time. LWBS 

## 2020-05-25 NOTE — ED Triage Notes (Signed)
Vaginal discharge x 3 days

## 2020-06-08 ENCOUNTER — Emergency Department (HOSPITAL_BASED_OUTPATIENT_CLINIC_OR_DEPARTMENT_OTHER)
Admission: EM | Admit: 2020-06-08 | Discharge: 2020-06-08 | Disposition: A | Discharge status: Home / Self Care | Payer: 59 | Attending: Emergency Medicine | Admitting: Emergency Medicine

## 2020-06-08 ENCOUNTER — Other Ambulatory Visit (HOSPITAL_BASED_OUTPATIENT_CLINIC_OR_DEPARTMENT_OTHER): Payer: Self-pay | Admitting: Emergency Medicine

## 2020-06-08 ENCOUNTER — Other Ambulatory Visit: Payer: Self-pay

## 2020-06-08 ENCOUNTER — Encounter (HOSPITAL_BASED_OUTPATIENT_CLINIC_OR_DEPARTMENT_OTHER): Payer: Self-pay

## 2020-06-08 DIAGNOSIS — R059 Cough, unspecified: Secondary | ICD-10-CM | POA: Diagnosis present

## 2020-06-08 DIAGNOSIS — Z20822 Contact with and (suspected) exposure to covid-19: Secondary | ICD-10-CM | POA: Insufficient documentation

## 2020-06-08 DIAGNOSIS — B349 Viral infection, unspecified: Secondary | ICD-10-CM | POA: Diagnosis not present

## 2020-06-08 DIAGNOSIS — Z03818 Encounter for observation for suspected exposure to other biological agents ruled out: Secondary | ICD-10-CM | POA: Diagnosis not present

## 2020-06-08 LAB — SARS CORONAVIRUS 2 (TAT 6-24 HRS): SARS Coronavirus 2: NEGATIVE

## 2020-06-08 MED ORDER — BENZONATATE 100 MG PO CAPS
100.0000 mg | ORAL_CAPSULE | Freq: Three times a day (TID) | ORAL | 0 refills | Status: DC
Start: 2020-06-08 — End: 2020-06-08

## 2020-06-08 MED ORDER — FLUTICASONE PROPIONATE 50 MCG/ACT NA SUSP
2.0000 | Freq: Every day | NASAL | 0 refills | Status: DC
Start: 2020-06-08 — End: 2020-06-08

## 2020-06-08 MED ORDER — ONDANSETRON 4 MG PO TBDP
4.0000 mg | ORAL_TABLET | Freq: Three times a day (TID) | ORAL | 0 refills | Status: DC | PRN
Start: 2020-06-08 — End: 2020-06-08

## 2020-06-08 MED ORDER — PROMETHAZINE-DM 6.25-15 MG/5ML PO SYRP
5.0000 mL | ORAL_SOLUTION | Freq: Four times a day (QID) | ORAL | 0 refills | Status: DC | PRN
Start: 2020-06-08 — End: 2020-06-08

## 2020-06-08 MED FILL — PROMETHAZINE W/DM SYRUP: 6.25-15 | 10 days supply | Qty: 118 | Fill #0

## 2020-06-08 MED FILL — BENZONATATE 100 MG CAPS: 100 | 7 days supply | Qty: 21 | Fill #0

## 2020-06-08 MED FILL — ONDANSETRON ODT 4 MG TABLET: 4 | 6 days supply | Qty: 20 | Fill #0

## 2020-06-08 MED FILL — FLUTICASONE PROP 50 MCG SPR: 50 | 30 days supply | Qty: 16 | Fill #0

## 2020-06-08 NOTE — ED Triage Notes (Signed)
Pt reports sore throat and nasal congestion x2-3 days. Pt also reports headache.

## 2020-06-08 NOTE — ED Provider Notes (Signed)
[facility] EMERGENCY DEPARTMENT Provider Note   CSN: [id number] Arrival date & time: 06/08/20  0350     History Chief Complaint  Patient presents with  . Sore Throat  . Nasal Congestion    Autumn Gibson is a 25 y.o. female.  HPI Patient is a well-appearing 24 year old female presenting today with myalgias, congestion, sore throat, mild cough.  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  She states she is vaccinated.  Has not had her booster yet.  She denies any chest pain shortness of breath.   Patient is states that her symptoms have been present for 2 or 3 days they have been not worsening constant with no aggravating mitigating factors.  She has not taken any Tylenol or ibuprofen.  She has used some Mucinex with mild relief.   No other associated symptoms apart from some loose stool which she describes over the past 2 days.    Past Medical History:  Diagnosis Date  . ADHD (attention deficit hyperactivity disorder)   . Eczema     Patient Active Problem List   Diagnosis Date Noted  . SVD (spontaneous vaginal delivery) 10/01/2015  . Postpartum care following vaginal delivery 10/01/2015  . Term pregnancy 09/30/2015    Past Surgical History:  Procedure Laterality Date  . NO PAST SURGERIES       OB History    Gravida  1   Para  1   Term  1   Preterm      AB      Living  1     SAB      IAB      Ectopic      Multiple  0   Live Births  1           Family History  Problem Relation Age of Onset  . Hypertension Father   . Hypertension Other     Social History   Tobacco Use  . Smoking status: Never Smoker  . Smokeless tobacco: Never Used  Substance Use Topics  . Alcohol use: Yes    Comment: occ  . Drug use: No    Home Medications Prior to Admission medications   Medication Sig Start Date End Date Taking? Authorizing Provider  benzonatate (TESSALON) 100 MG capsule Take 1 capsule (100  mg total) by mouth every 8 (eight) hours. 06/08/20  Yes Arvie Villarruel S, PA  fluticasone (FLONASE) 50 MCG/ACT nasal spray Place 2 sprays into both nostrils daily for 14 days. 06/08/20 06/22/20 Yes Rasheena Talmadge S, PA  ondansetron (ZOFRAN ODT) 4 MG disintegrating tablet Take 1 tablet (4 mg total) by mouth every 8 (eight) hours as needed for nausea or vomiting. 06/08/20  Yes Kaveon Blatz S, PA  promethazine-dextromethorphan (PROMETHAZINE-DM) 6.25-15 MG/5ML syrup Take 5 mLs by mouth 4 (four) times daily as needed for cough. 06/08/20  Yes Milen Lengacher S, PA  amoxicillin (AMOXIL) 500 MG capsule Take 1 capsule (500 mg total) by mouth 3 (three) times daily. 09/01/17   Raeford Razor, MD  meloxicam (MOBIC) 15 MG tablet Take 1 tablet (15 mg total) by mouth daily. 09/26/16   Robinson, [place] N, PA-C  Prenatal Vit-Fe Fumarate-FA (PRENATAL MULTIVITAMIN) TABS tablet Take 1 tablet by mouth daily at 12 noon.    [provider]    Allergies    Patient has no known allergies.  Review of Systems   Review of Systems  Constitutional: Positive for fatigue. Negative for  chills and fever.  HENT: Positive for congestion, postnasal drip, rhinorrhea and sore throat.   Eyes: Negative for redness.  Respiratory: Positive for cough. Negative for shortness of breath.   Cardiovascular: Negative for chest pain and leg swelling.  Gastrointestinal: Positive for diarrhea (few loose stools). Negative for abdominal pain, nausea and vomiting.  Endocrine: Negative for polyphagia.  Genitourinary: Negative for dysuria.  Musculoskeletal: Positive for myalgias.  Skin: Negative for rash.  Neurological: Positive for headaches. Negative for syncope.  Psychiatric/Behavioral: Negative for confusion.    Physical Exam Updated Vital Signs BP 115/78 (BP Location: Left Arm)   Pulse (!) 103   Temp 98.5 F (36.9 C) (Oral)   Resp 16   Ht 5\' 3"  (1.6 m)   Wt 73.5 kg   LMP 05/18/2020   SpO2 99%   BMI 28.70 kg/m   Physical  Exam Vitals and nursing note reviewed.  Constitutional:      General: She is not in acute distress.    Comments: Pleasant well-appearing 25 year old.  In no acute distress.  Sitting comfortably in bed.  Able answer questions appropriately follow commands. No increased work of breathing. Speaking in full sentences.  HENT:     Head: Normocephalic and atraumatic.     Nose: Nose normal.     Mouth/Throat:     Mouth: Mucous membranes are moist.     Comments: Cobblestoning and PND Eyes:     General: No scleral icterus. Cardiovascular:     Rate and Rhythm: Normal rate and regular rhythm.     Pulses: Normal pulses.     Heart sounds: Normal heart sounds.  Pulmonary:     Effort: Pulmonary effort is normal. No respiratory distress.     Breath sounds: No wheezing.     Comments: Lungs CTAB No tachypnea or increased WOB Abdominal:     Palpations: Abdomen is soft.     Tenderness: There is no abdominal tenderness.  Musculoskeletal:     Cervical back: Normal range of motion.     Right lower leg: No edema.     Left lower leg: No edema.  Skin:    General: Skin is warm and dry.     Capillary Refill: Capillary refill takes less than 2 seconds.  Neurological:     Mental Status: She is alert. Mental status is at baseline.  Psychiatric:        Mood and Affect: Mood normal.        Behavior: Behavior normal.     ED Results / Procedures / Treatments   Labs (all labs ordered are listed, but only abnormal results are displayed) Labs Reviewed  SARS CORONAVIRUS 2 (TAT 6-24 HRS)    EKG None  Radiology No results found.  Procedures Procedures (including critical care time)  Medications Ordered in ED Medications - No data to display  ED Course  I have reviewed the triage vital signs and the nursing notes.  Pertinent labs & imaging results that were available during my care of the patient were reviewed by me and considered in my medical decision making (see chart for details).    MDM  Rules/Calculators/A&P                          Patient is a well-appearing 25 year old female presenting today with myalgias, congestion, sore throat, mild cough.  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  She states she is vaccinated.  Has  not had her booster yet.  She denies any chest pain shortness of breath.  Physical exam is notable for well-appearing 25 year old female in no acute distress vital signs within normal limits she was noted to have mild tachycardia in triage this is resolved at time of my evaluation.  Lungs are clear to auscultation all fields.  Posterior pharynx clear apart from PND.  Patient on strict return precautions discharged with conservative therapy and symptomatic treatment.  Autumn Gibson was evaluated in Emergency Department on 06/08/2020 for the symptoms described in the history of present illness. She was evaluated in the context of the global COVID-19 pandemic, which necessitated consideration that the patient might be at risk for infection with the SARS-CoV-2 virus that causes COVID-19. Institutional protocols and algorithms that pertain to the evaluation of patients at risk for COVID-19 are in a state of rapid change based on information released by regulatory bodies including the CDC and federal and state organizations. These policies and algorithms were followed during the patient's care in the ED.  Final Clinical Impression(s) / ED Diagnoses Final diagnoses:  Suspected COVID-19 virus infection  Viral syndrome    Rx / DC Orders ED Discharge Orders         Ordered    benzonatate (TESSALON) 100 MG capsule  Every 8 hours        06/08/20 1026    fluticasone (FLONASE) 50 MCG/ACT nasal spray  Daily        06/08/20 1026    ondansetron (ZOFRAN ODT) 4 MG disintegrating tablet  Every 8 hours PRN        06/08/20 1026    promethazine-dextromethorphan (PROMETHAZINE-DM) 6.25-15 MG/5ML syrup  4 times daily PRN        06/08/20  1026           Solon Augusta [place], [place] 06/08/20 1032    Pollyann Savoy, MD 06/08/20 1054

## 2020-06-08 NOTE — Discharge Instructions (Addendum)
Your COVID test is pending; you should expect results within 24 hours. You can access your results on your MyChart--if you test positive you should receive a phone call.  In the meantime follow CDC guidelines and quarantine, wear a mask, wash hands often.   Please use Tylenol or ibuprofen for pain.  You may use 600 mg ibuprofen every 6 hours or 1000 mg of Tylenol every 6 hours.  You may choose to alternate between the 2.  This would be most effective.  Not to exceed 4 g of Tylenol within 24 hours.  Not to exceed 3200 mg ibuprofen 24 hours.   Please take over the counter vitamin D 2000-4000 units per day. I also recommend zinc 50 mg per day for the next two weeks.   Please return to ED if you feel have difficulty breathing or have emergent, new or concerning symptoms.  Patients who have symptoms consistent with COVID-19 should self isolated for: At least 3 days (72 hours) have passed since recovery, defined as resolution of fever without the use of fever reducing medications and improvement in respiratory symptoms (e.g., cough, shortness of breath), and At least 7 days have passed since symptoms first appeared.       Person Under Monitoring Name: Autumn Gibson  Location: [address] [place] [address]   Infection Prevention Recommendations for Individuals Confirmed to have, or Being Evaluated for, 2019 Novel Coronavirus (COVID-19) Infection Who Receive Care at Home  Individuals who are confirmed to have, or are being evaluated for, COVID-19 should follow the prevention steps below until a healthcare provider or local or state health department says they can return to normal activities.  Stay home except to get medical care You should restrict activities outside your home, except for getting medical care. Do not go to work, school, or public areas, and do not use public transportation or taxis.  Call ahead before visiting your doctor Before your medical appointment,  call the healthcare provider and tell them that you have, or are being evaluated for, COVID-19 infection. This will help the healthcare providers office take steps to keep other people from getting infected. Ask your healthcare provider to call the local or state health department.  Monitor your symptoms Seek prompt medical attention if your illness is worsening (e.g., difficulty breathing). Before going to your medical appointment, call the healthcare provider and tell them that you have, or are being evaluated for, COVID-19 infection. Ask your healthcare provider to call the local or state health department.  Wear a facemask You should wear a facemask that covers your nose and mouth when you are in the same room with other people and when you visit a healthcare provider. People who live with or visit you should also wear a facemask while they are in the same room with you.  Separate yourself from other people in your home As much as possible, you should stay in a different room from other people in your home. Also, you should use a separate bathroom, if available.  Avoid sharing household items You should not share dishes, drinking glasses, cups, eating utensils, towels, bedding, or other items with other people in your home. After using these items, you should wash them thoroughly with soap and water.  Cover your coughs and sneezes Cover your mouth and nose with a tissue when you cough or sneeze, or you can cough or sneeze into your sleeve. Throw used tissues in a lined trash can, and immediately wash your  hands with soap and water for at least 20 seconds or use an alcohol-based hand rub.  Wash your [employer] your hands often and thoroughly with soap and water for at least 20 seconds. You can use an alcohol-based hand sanitizer if soap and water are not available and if your hands are not visibly dirty. Avoid touching your eyes, nose, and mouth with unwashed hands.   Prevention  Steps for Caregivers and Household Members of Individuals Confirmed to have, or Being Evaluated for, COVID-19 Infection Being Cared for in the Home  If you live with, or provide care at home for, a person confirmed to have, or being evaluated for, COVID-19 infection please follow these guidelines to prevent infection:  Follow healthcare providers instructions Make sure that you understand and can help the patient follow any healthcare provider instructions for all care.  Provide for the patients basic needs You should help the patient with basic needs in the home and provide support for getting groceries, prescriptions, and other personal needs.  Monitor the patients symptoms If they are getting sicker, call his or her medical provider and tell them that the patient has, or is being evaluated for, COVID-19 infection. This will help the healthcare providers office take steps to keep other people from getting infected. Ask the healthcare provider to call the local or state health department.  Limit the number of people who have contact with the patient If possible, have only one caregiver for the patient. Other household members should stay in another home or place of residence. If this is not possible, they should stay in another room, or be separated from the patient as much as possible. Use a separate bathroom, if available. Restrict visitors who do not have an essential need to be in the home.  Keep older adults, very young children, and other sick people away from the patient Keep older adults, very young children, and those who have compromised immune systems or chronic health conditions away from the patient. This includes people with chronic heart, lung, or kidney conditions, diabetes, and cancer.  Ensure good ventilation Make sure that shared spaces in the home have good air flow, such as from an air conditioner or an opened window, weather permitting.  Wash your hands  often Wash your hands often and thoroughly with soap and water for at least 20 seconds. You can use an alcohol based hand sanitizer if soap and water are not available and if your hands are not visibly dirty. Avoid touching your eyes, nose, and mouth with unwashed hands. Use disposable paper towels to dry your hands. If not available, use dedicated cloth towels and replace them when they become wet.  Wear a facemask and gloves Wear a disposable facemask at all times in the room and gloves when you touch or have contact with the patients blood, body fluids, and/or secretions or excretions, such as sweat, saliva, sputum, nasal mucus, vomit, urine, or feces.  Ensure the mask fits over your nose and mouth tightly, and do not touch it during use. Throw out disposable facemasks and gloves after using them. Do not reuse. Wash your hands immediately after removing your facemask and gloves. If your personal clothing becomes contaminated, carefully remove clothing and launder. Wash your hands after handling contaminated clothing. Place all used disposable facemasks, gloves, and other waste in a lined container before disposing them with other household waste. Remove gloves and wash your hands immediately after handling these items.  Do not share dishes, glasses,  or other household items with the patient Avoid sharing household items. You should not share dishes, drinking glasses, cups, eating utensils, towels, bedding, or other items with a patient who is confirmed to have, or being evaluated for, COVID-19 infection. After the person uses these items, you should wash them thoroughly with soap and water.  Wash laundry thoroughly Immediately remove and wash clothes or bedding that have blood, body fluids, and/or secretions or excretions, such as sweat, saliva, sputum, nasal mucus, vomit, urine, or feces, on them. Wear gloves when handling laundry from the patient. Read and follow directions on labels of  laundry or clothing items and detergent. In general, wash and dry with the warmest temperatures recommended on the label.  Clean all areas the individual has used often Clean all touchable surfaces, such as counters, tabletops, doorknobs, bathroom fixtures, toilets, phones, keyboards, tablets, and bedside tables, every day. Also, clean any surfaces that may have blood, body fluids, and/or secretions or excretions on them. Wear gloves when cleaning surfaces the patient has come in contact with. Use a diluted bleach solution (e.g., dilute bleach with 1 part bleach and 10 parts water) or a household disinfectant with a label that says EPA-registered for coronaviruses. To make a bleach solution at home, add 1 tablespoon of bleach to 1 quart (4 cups) of water. For a larger supply, add  cup of bleach to 1 gallon (16 cups) of water. Read labels of cleaning products and follow recommendations provided on product labels. Labels contain instructions for safe and effective use of the cleaning product including precautions you should take when applying the product, such as wearing gloves or eye protection and making sure you have good ventilation during use of the product. Remove gloves and wash hands immediately after cleaning.  Monitor yourself for signs and symptoms of illness Caregivers and household members are considered close contacts, should monitor their health, and will be asked to limit movement outside of the home to the extent possible. Follow the monitoring steps for close contacts listed on the symptom monitoring form.   ? If you have additional questions, contact your local health department or call the epidemiologist on call at [phone] (available 24/7). ? This guidance is subject to change. For the most up-to-date guidance from [facility], please refer to their website: [email or web address]

## 2020-06-27 ENCOUNTER — Emergency Department (HOSPITAL_COMMUNITY): Admission: EM | Admit: 2020-06-27 | Discharge: 2020-06-27 | Discharge status: Against Medical Advice | Payer: 59

## 2020-06-30 DIAGNOSIS — N760 Acute vaginitis: Secondary | ICD-10-CM | POA: Diagnosis not present

## 2020-08-01 DIAGNOSIS — N760 Acute vaginitis: Secondary | ICD-10-CM | POA: Diagnosis not present

## 2020-08-01 DIAGNOSIS — Z113 Encounter for screening for infections with a predominantly sexual mode of transmission: Secondary | ICD-10-CM | POA: Diagnosis not present

## 2020-08-01 DIAGNOSIS — N898 Other specified noninflammatory disorders of vagina: Secondary | ICD-10-CM | POA: Diagnosis not present

## 2020-08-24 DIAGNOSIS — N760 Acute vaginitis: Secondary | ICD-10-CM | POA: Diagnosis not present

## 2020-08-24 DIAGNOSIS — Z8742 Personal history of other diseases of the female genital tract: Secondary | ICD-10-CM | POA: Diagnosis not present

## 2020-08-24 DIAGNOSIS — N898 Other specified noninflammatory disorders of vagina: Secondary | ICD-10-CM | POA: Diagnosis not present

## 2021-01-02 DIAGNOSIS — Z113 Encounter for screening for infections with a predominantly sexual mode of transmission: Secondary | ICD-10-CM | POA: Diagnosis not present

## 2021-01-02 DIAGNOSIS — N760 Acute vaginitis: Secondary | ICD-10-CM | POA: Diagnosis not present

## 2021-01-02 DIAGNOSIS — N898 Other specified noninflammatory disorders of vagina: Secondary | ICD-10-CM | POA: Diagnosis not present

## 2021-02-18 ENCOUNTER — Emergency Department (HOSPITAL_BASED_OUTPATIENT_CLINIC_OR_DEPARTMENT_OTHER)
Admission: EM | Admit: 2021-02-18 | Discharge: 2021-02-18 | Disposition: A | Discharge status: Home / Self Care | Payer: 59 | Attending: Emergency Medicine | Admitting: Emergency Medicine

## 2021-02-18 ENCOUNTER — Encounter (HOSPITAL_BASED_OUTPATIENT_CLINIC_OR_DEPARTMENT_OTHER): Payer: Self-pay

## 2021-02-18 ENCOUNTER — Other Ambulatory Visit: Payer: Self-pay

## 2021-02-18 DIAGNOSIS — N898 Other specified noninflammatory disorders of vagina: Secondary | ICD-10-CM | POA: Insufficient documentation

## 2021-02-18 DIAGNOSIS — N760 Acute vaginitis: Secondary | ICD-10-CM | POA: Diagnosis not present

## 2021-02-18 DIAGNOSIS — B9689 Other specified bacterial agents as the cause of diseases classified elsewhere: Secondary | ICD-10-CM | POA: Diagnosis not present

## 2021-02-18 LAB — URINALYSIS, MICROSCOPIC (REFLEX)

## 2021-02-18 LAB — URINALYSIS, ROUTINE W REFLEX MICROSCOPIC
Bilirubin Urine: NEGATIVE
Glucose, UA: NEGATIVE mg/dL
Hgb urine dipstick: NEGATIVE
Ketones, ur: NEGATIVE mg/dL
Nitrite: NEGATIVE
Protein, ur: NEGATIVE mg/dL
Specific Gravity, Urine: 1.015 (ref 1.005–1.030)
pH: 6 (ref 5.0–8.0)

## 2021-02-18 LAB — PREGNANCY, URINE: Preg Test, Ur: NEGATIVE

## 2021-02-18 LAB — WET PREP, GENITAL
Clue Cells Wet Prep HPF POC: NONE SEEN
Sperm: NONE SEEN
Trich, Wet Prep: NONE SEEN
Yeast Wet Prep HPF POC: NONE SEEN

## 2021-02-18 MED ORDER — FLUCONAZOLE 150 MG PO TABS: 150.0000 mg | ORAL_TABLET | Freq: Every day | ORAL | 0 refills | Status: AC

## 2021-02-18 MED ORDER — METRONIDAZOLE 500 MG PO TABS
500.0000 mg | ORAL_TABLET | Freq: Two times a day (BID) | ORAL | 0 refills | Status: DC
Start: 2021-02-18 — End: 2021-06-23

## 2021-02-18 NOTE — ED Provider Notes (Signed)
[facility] EMERGENCY DEPARTMENT Provider Note   CSN: [id number] Arrival date & time: 02/18/21  1914     History Chief Complaint  Patient presents with   Vaginal Odor    Autumn Gibson is a 25 y.o. female past medical history of medical termination of pregnancy presenting today with a complaint of vaginal odor.  Patient reports that 2 weeks ago she had a medical abortion and that she bled until Monday and after she stopped bleeding she noticed a vaginal odor.  She says that this smells like previous occasions of bacterial vaginosis.  She has used boric acid for the symptoms in the past however reports that she feels that she needs antibiotics.  Did not have discharge until this morning which she described as white.  No itching or pain.  Sexually active with barrier protection.  No concern for STDs.  No urinary symptoms.  Past Medical History:  Diagnosis Date   ADHD (attention deficit hyperactivity disorder)    Eczema     Patient Active Problem List   Diagnosis Date Noted   SVD (spontaneous vaginal delivery) 10/01/2015   Postpartum care following vaginal delivery 10/01/2015   Term pregnancy 09/30/2015    Past Surgical History:  Procedure Laterality Date   INDUCED ABORTION     NO PAST SURGERIES       OB History     Gravida  1   Para  1   Term  1   Preterm      AB      Living  1      SAB      IAB      Ectopic      Multiple  0   Live Births  1           Family History  Problem Relation Age of Onset   Hypertension Father    Hypertension Other     Social History   Tobacco Use   Smoking status: Never   Smokeless tobacco: Never  Vaping Use   Vaping Use: Never used  Substance Use Topics   Alcohol use: Yes    Comment: occ   Drug use: No    Home Medications Prior to Admission medications   Medication Sig Start Date End Date Taking? Authorizing Provider  amoxicillin (AMOXIL) 500 MG capsule Take 1 capsule (500 mg total) by mouth  3 (three) times daily. 09/01/17   Raeford Razor, MD  benzonatate (TESSALON) 100 MG capsule TAKE 1 CAPSULE (100 MG TOTAL) BY MOUTH EVERY 8 (EIGHT) HOURS. 06/08/20 06/08/21  Fondaw, Stevphen Meuse S, PA  fluticasone (FLONASE) 50 MCG/ACT nasal spray PLACE 2 SPRAYS INTO BOTH NOSTRILS DAILY FOR 14 DAYS. 06/08/20 06/08/21  Gailen Shelter, PA  meloxicam (MOBIC) 15 MG tablet Take 1 tablet (15 mg total) by mouth daily. 09/26/16   Robinson, [place] N, PA-C  ondansetron (ZOFRAN-ODT) 4 MG disintegrating tablet TAKE 1 TABLET (4 MG TOTAL) BY MOUTH EVERY 8 (EIGHT) HOURS AS NEEDED FOR NAUSEA OR VOMITING. 06/08/20 06/08/21  Gailen Shelter, PA  Prenatal Vit-Fe Fumarate-FA (PRENATAL MULTIVITAMIN) TABS tablet Take 1 tablet by mouth daily at 12 noon.    [provider]  promethazine-dextromethorphan (PROMETHAZINE-DM) 6.25-15 MG/5ML syrup TAKE 5 MLS BY MOUTH 4 (FOUR) TIMES DAILY AS NEEDED FOR COUGH. 06/08/20 06/08/21  Gailen Shelter, PA    Allergies    Patient has no known allergies.  Review of Systems   Review of Systems  Constitutional:  Negative for chills and  fever.  Gastrointestinal:  Negative for abdominal pain, nausea and vomiting.  Genitourinary:  Positive for vaginal discharge. Negative for dysuria, frequency, urgency, vaginal bleeding and vaginal pain.  Skin:  Negative for rash and wound.  All other systems reviewed and are negative.  Physical Exam Updated Vital Signs BP 110/76   Pulse 70   Temp 98.6 F (37 C) (Oral)   Resp 18   Ht 5\' 3"  (1.6 m)   Wt 72.6 kg   SpO2 99%   BMI 28.34 kg/m   Physical Exam Vitals and nursing note reviewed.  Constitutional:      Appearance: Normal appearance.  HENT:     Head: Normocephalic and atraumatic.     Mouth/Throat:     Mouth: Mucous membranes are moist.     Pharynx: Oropharynx is clear.  Eyes:     General: No scleral icterus.    Conjunctiva/sclera: Conjunctivae normal.  Pulmonary:     Effort: Pulmonary effort is normal. No respiratory distress.   Abdominal:     General: Abdomen is flat.     Tenderness: There is no abdominal tenderness.  Genitourinary:    General: Normal vulva.     Vagina: Vaginal discharge present.     Comments: Patient with large amounts of watery discharge coming from the cervix.  The cervix without abnormalities.  No lesions or masses in the vaginal vault and no masses palpated on bimanual exam. Skin:    General: Skin is warm and dry.     Findings: No lesion or rash.  Neurological:     Mental Status: She is alert.  Psychiatric:        Mood and Affect: Mood normal.    ED Results / Procedures / Treatments   Labs (all labs ordered are listed, but only abnormal results are displayed) Labs Reviewed  WET PREP, GENITAL - Abnormal; Notable for the following components:      Result Value   WBC, Wet Prep HPF POC MANY (*)    All other components within normal limits  URINALYSIS, ROUTINE W REFLEX MICROSCOPIC - Abnormal; Notable for the following components:   Leukocytes,Ua SMALL (*)    All other components within normal limits  URINALYSIS, MICROSCOPIC (REFLEX) - Abnormal; Notable for the following components:   Bacteria, UA MANY (*)    All other components within normal limits  PREGNANCY, URINE  GC/CHLAMYDIA PROBE AMP ([place]) NOT AT [facility]    EKG None  Radiology No results found.  Procedures Procedures   Medications Ordered in ED Medications - No data to display  ED Course  I have reviewed the triage vital signs and the nursing notes.  Pertinent labs & imaging results that were available during my care of the patient were reviewed by me and considered in my medical decision making (see chart for details).   MDM Rules/Calculators/A&P Autumn Gibson is a 25 y.o. female past medical history of medical termination of pregnancy presenting today with a complaint of vaginal odor.  Patient reports that 2 weeks ago she had a medical abortion and that she bled until Monday and after she stopped  bleeding she noticed a vaginal odor.  She says that this smells like previous occasions of bacterial vaginosis.  She has used boric acid for the symptoms in the past however reports that she feels that she needs antibiotics.  Did not have discharge until this morning which she described as white.  No itching or pain.  Sexually active with barrier protection.  No  concern for STDs.   Patient was evaluated by me.  In no acute distress.  I performed a pelvic exam with RN as chaperone.  She tolerated this well.  Physical exam was consistent with bacterial vaginosis.  No cervical motion tenderness or concern for PID at this time.  Wet prep revealed white blood cells however no clue cells.  No yeast infection.  Because of my physical exam and patient's symptoms I will treat her for bacterial vaginosis.  I discussed the test results as well as my treatment plan with the patient.  Urine pregnancy negative.  Urinalysis positive for bacteria.  Also with leukocytes.  Possible contamination from large amounts of vaginal discharge.  No urinary symptoms so I will not treat for urinary tract infection.  I will send metronidazole to the patient's pharmacy for treatment of her vaginal odor and discharge.  I will also send a one-time dose of Diflucan in the event that the patient has a yeast infection after antibiotic treatment.  Information about bacterial vaginosis attached to patient's discharge papers.  Patient reports no questions at this time.  Her GC chlamydia test will not come back today.  Patient will look out for a phone call and results in her chart.  She denied the need for HIV blood testing today.  Final Clinical Impression(s) / ED Diagnoses Final diagnoses:  Vaginal odor    Rx / DC Orders Results and diagnoses were explained to the patient. Return precautions discussed in full. Patient had no additional questions and expressed complete understanding.     Woodroe Chen 02/18/21 1335     Alvira Monday, MD 02/19/21 [phone]

## 2021-02-18 NOTE — ED Notes (Signed)
Pt ambulatory with steady gait to bathroom to provide urine specimen.

## 2021-02-18 NOTE — ED Notes (Signed)
Chaperone for EDP for pelvic exam. Pt tolerated well.  ?

## 2021-02-18 NOTE — Discharge Instructions (Addendum)
Your testing did not show that you had bacterial vaginosis today however I do feel as though your physical exam is consistent with this diagnosis.  I am sending antibiotics to your pharmacy for treatment of bacterial vaginosis.  You may also utilize the Diflucan if you start to have symptoms of yeast infection after treatment.  Your urine sample was negative for pregnancy today.  I hope that you feel better!

## 2021-02-18 NOTE — ED Triage Notes (Signed)
Pt arrives ambulatory to ED with c/o vaginal odor after having a abortion 2 weeks ago pt reports this is similar to when she had BV in the past.

## 2021-02-19 LAB — GC/CHLAMYDIA PROBE AMP (~~LOC~~) NOT AT ARMC
Chlamydia: NEGATIVE
Comment: NEGATIVE
Comment: NORMAL
Neisseria Gonorrhea: NEGATIVE

## 2021-06-23 ENCOUNTER — Emergency Department (HOSPITAL_BASED_OUTPATIENT_CLINIC_OR_DEPARTMENT_OTHER)
Admission: EM | Admit: 2021-06-23 | Discharge: 2021-06-23 | Disposition: A | Discharge status: Home / Self Care | Payer: 59 | Attending: Emergency Medicine | Admitting: Emergency Medicine

## 2021-06-23 ENCOUNTER — Other Ambulatory Visit: Payer: Self-pay

## 2021-06-23 ENCOUNTER — Encounter (HOSPITAL_BASED_OUTPATIENT_CLINIC_OR_DEPARTMENT_OTHER): Payer: Self-pay | Admitting: Emergency Medicine

## 2021-06-23 DIAGNOSIS — N760 Acute vaginitis: Secondary | ICD-10-CM | POA: Insufficient documentation

## 2021-06-23 DIAGNOSIS — B9689 Other specified bacterial agents as the cause of diseases classified elsewhere: Secondary | ICD-10-CM | POA: Diagnosis not present

## 2021-06-23 DIAGNOSIS — N939 Abnormal uterine and vaginal bleeding, unspecified: Secondary | ICD-10-CM | POA: Diagnosis present

## 2021-06-23 LAB — URINALYSIS, MICROSCOPIC (REFLEX)

## 2021-06-23 LAB — CBC WITH DIFFERENTIAL/PLATELET
Abs Immature Granulocytes: 0.02 10*3/uL (ref 0.00–0.07)
Basophils Absolute: 0 10*3/uL (ref 0.0–0.1)
Basophils Relative: 1 %
Eosinophils Absolute: 0.2 10*3/uL (ref 0.0–0.5)
Eosinophils Relative: 4 %
HCT: 39.1 % (ref 36.0–46.0)
Hemoglobin: 13.2 g/dL (ref 12.0–15.0)
Immature Granulocytes: 0 %
Lymphocytes Relative: 23 %
Lymphs Abs: 1.2 10*3/uL (ref 0.7–4.0)
MCH: 27.7 pg (ref 26.0–34.0)
MCHC: 33.8 g/dL (ref 30.0–36.0)
MCV: 82.1 fL (ref 80.0–100.0)
Monocytes Absolute: 0.6 10*3/uL (ref 0.1–1.0)
Monocytes Relative: 11 %
Neutro Abs: 3.1 10*3/uL (ref 1.7–7.7)
Neutrophils Relative %: 61 %
Platelets: 289 10*3/uL (ref 150–400)
RBC: 4.76 MIL/uL (ref 3.87–5.11)
RDW: 14.8 % (ref 11.5–15.5)
WBC: 5.1 10*3/uL (ref 4.0–10.5)
nRBC: 0 % (ref 0.0–0.2)

## 2021-06-23 LAB — URINALYSIS, ROUTINE W REFLEX MICROSCOPIC
Bilirubin Urine: NEGATIVE
Glucose, UA: NEGATIVE mg/dL
Ketones, ur: NEGATIVE mg/dL
Nitrite: NEGATIVE
Protein, ur: NEGATIVE mg/dL
Specific Gravity, Urine: 1.015 (ref 1.005–1.030)
pH: 7 (ref 5.0–8.0)

## 2021-06-23 LAB — BASIC METABOLIC PANEL
Anion gap: 9 (ref 5–15)
BUN: 13 mg/dL (ref 6–20)
CO2: 26 mmol/L (ref 22–32)
Calcium: 8.6 mg/dL — ABNORMAL LOW (ref 8.9–10.3)
Chloride: 101 mmol/L (ref 98–111)
Creatinine, Ser: 0.76 mg/dL (ref 0.44–1.00)
GFR, Estimated: >60 mL/min (ref >60)
Glucose, Bld: 98 mg/dL (ref 70–99)
Potassium: 3.5 mmol/L (ref 3.5–5.1)
Sodium: 136 mmol/L (ref 135–145)

## 2021-06-23 LAB — WET PREP, GENITAL
Sperm: NONE SEEN
Trich, Wet Prep: NONE SEEN
WBC, Wet Prep HPF POC: >=10 — AB (ref <10)
Yeast Wet Prep HPF POC: NONE SEEN

## 2021-06-23 LAB — PREGNANCY, URINE: Preg Test, Ur: NEGATIVE

## 2021-06-23 MED ORDER — METRONIDAZOLE 500 MG PO TABS: 500.0000 mg | ORAL_TABLET | Freq: Two times a day (BID) | ORAL | 0 refills | Status: AC

## 2021-06-23 NOTE — Discharge Instructions (Signed)
Your test today showed signs of bacterial vaginosis.  Take the Prescription as written.  Follow-up with your OB/GYN doctor in 2 to 3 days.  Return back to the ER if you have pain fevers or any additional concerns.

## 2021-06-23 NOTE — ED Notes (Signed)
Pt instructed to self swab and able to collect specimen

## 2021-06-23 NOTE — ED Triage Notes (Signed)
Pt states she had her period last week and it usually only last 4 days but states she is continuing to have some bleeding and it has an odor

## 2021-06-23 NOTE — ED Provider Notes (Signed)
[facility] EMERGENCY DEPARTMENT Provider Note   CSN: [id number] Arrival date & time: 06/23/21  3614     History  Chief Complaint  Patient presents with   Vaginal Bleeding    Autumn Gibson is a 26 y.o. female.  Patient presents to ER chief complaint of vaginal spotting.  She states that she is on her normal menses.  Typically it only lasts about 4 to 5 days but this time has been lasting about 7 days with small amount of vaginal spotting today.  She also noticed an odor from her vagina consistent with prior episodes of bacterial vaginosis.  Denies fevers or cough denies vomiting or diarrhea.  Denies abdominal pain.  She sexually active with 1 partner and uses barrier protection.  Denies vaginal discharge      Home Medications Prior to Admission medications   Medication Sig Start Date End Date Taking? Authorizing Provider  metroNIDAZOLE (FLAGYL) 500 MG tablet Take 1 tablet (500 mg total) by mouth 2 (two) times daily. 06/23/21  Yes Vivika Poythress, Eustace Moore, MD  amoxicillin (AMOXIL) 500 MG capsule Take 1 capsule (500 mg total) by mouth 3 (three) times daily. 09/01/17   Raeford Razor, MD  fluticasone (FLONASE) 50 MCG/ACT nasal spray PLACE 2 SPRAYS INTO BOTH NOSTRILS DAILY FOR 14 DAYS. 06/08/20 06/08/21  Gailen Shelter, PA  meloxicam (MOBIC) 15 MG tablet Take 1 tablet (15 mg total) by mouth daily. 09/26/16   Robinson, [place] N, PA-C  Prenatal Vit-Fe Fumarate-FA (PRENATAL MULTIVITAMIN) TABS tablet Take 1 tablet by mouth daily at 12 noon.    [provider]      Allergies    Patient has no known allergies.    Review of Systems   Review of Systems  Constitutional:  Negative for fever.  HENT:  Negative for ear pain.   Eyes:  Negative for pain.  Respiratory:  Negative for cough.   Cardiovascular:  Negative for chest pain.  Gastrointestinal:  Negative for abdominal pain.  Genitourinary:  Negative for flank pain.  Musculoskeletal:  Negative for back pain.  Skin:  Negative  for rash.  Neurological:  Negative for headaches.   Physical Exam Updated Vital Signs BP 106/90 (BP Location: Right Arm)    Pulse 70    Temp 97.8 F (36.6 C) (Oral)    Resp 14    Ht 5\' 3"  (1.6 m)    Wt 77.1 kg    LMP 06/10/2021 (Approximate)    SpO2 100%    BMI 30.11 kg/m  Physical Exam Constitutional:      General: She is not in acute distress.    Appearance: Normal appearance.  HENT:     Head: Normocephalic.     Nose: Nose normal.  Eyes:     Extraocular Movements: Extraocular movements intact.  Cardiovascular:     Rate and Rhythm: Normal rate.  Pulmonary:     Effort: Pulmonary effort is normal.  Abdominal:     Tenderness: There is no abdominal tenderness. There is no guarding or rebound.  Musculoskeletal:        General: Normal range of motion.     Cervical back: Normal range of motion.  Neurological:     General: No focal deficit present.     Mental Status: She is alert. Mental status is at baseline.    ED Results / Procedures / Treatments   Labs (all labs ordered are listed, but only abnormal results are displayed) Labs Reviewed  WET PREP, GENITAL - Abnormal; Notable  for the following components:      Result Value   Clue Cells Wet Prep HPF POC PRESENT (*)    WBC, Wet Prep HPF POC >=10 (*)    All other components within normal limits  BASIC METABOLIC PANEL - Abnormal; Notable for the following components:   Calcium 8.6 (*)    All other components within normal limits  URINALYSIS, ROUTINE W REFLEX MICROSCOPIC - Abnormal; Notable for the following components:   APPearance HAZY (*)    Hgb urine dipstick MODERATE (*)    Leukocytes,Ua SMALL (*)    All other components within normal limits  URINALYSIS, MICROSCOPIC (REFLEX) - Abnormal; Notable for the following components:   Bacteria, UA FEW (*)    All other components within normal limits  CBC WITH DIFFERENTIAL/PLATELET  PREGNANCY, URINE    EKG None  Radiology No results found.  Procedures Procedures     Medications Ordered in ED Medications - No data to display  ED Course/ Medical Decision Making/ A&P                           Medical Decision Making Amount and/or Complexity of Data Reviewed Labs: ordered.    Details: Labs reviewed and appears unremarkable white count normal hemoglobin normal chemistry normal urinalysis showing just trace bacteria.  Wet mount is positive for clue cells.   Will advise outpatient follow-up again with OB/GYN this week.  Advising immediate return if she has pain fevers or any additional concerns.        Final Clinical Impression(s) / ED Diagnoses Final diagnoses:  Bacterial vaginosis    Rx / DC Orders ED Discharge Orders          Ordered    metroNIDAZOLE (FLAGYL) 500 MG tablet  2 times daily        06/23/21 0739              Cheryll Cockayne, MD 06/23/21 [phone]

## 2021-08-30 ENCOUNTER — Other Ambulatory Visit (HOSPITAL_BASED_OUTPATIENT_CLINIC_OR_DEPARTMENT_OTHER): Payer: Self-pay

## 2021-12-02 ENCOUNTER — Emergency Department (HOSPITAL_BASED_OUTPATIENT_CLINIC_OR_DEPARTMENT_OTHER): Payer: 59

## 2021-12-02 ENCOUNTER — Other Ambulatory Visit: Payer: Self-pay

## 2021-12-02 ENCOUNTER — Encounter (HOSPITAL_BASED_OUTPATIENT_CLINIC_OR_DEPARTMENT_OTHER): Payer: Self-pay | Admitting: Emergency Medicine

## 2021-12-02 ENCOUNTER — Emergency Department (HOSPITAL_BASED_OUTPATIENT_CLINIC_OR_DEPARTMENT_OTHER)
Admission: EM | Admit: 2021-12-02 | Discharge: 2021-12-02 | Disposition: A | Discharge status: Home / Self Care | Payer: 59 | Attending: Emergency Medicine | Admitting: Emergency Medicine

## 2021-12-02 DIAGNOSIS — S060X0A Concussion without loss of consciousness, initial encounter: Secondary | ICD-10-CM | POA: Insufficient documentation

## 2021-12-02 DIAGNOSIS — S060X9A Concussion with loss of consciousness of unspecified duration, initial encounter: Secondary | ICD-10-CM

## 2021-12-02 DIAGNOSIS — S0990XA Unspecified injury of head, initial encounter: Secondary | ICD-10-CM | POA: Diagnosis present

## 2021-12-02 DIAGNOSIS — S0993XA Unspecified injury of face, initial encounter: Secondary | ICD-10-CM | POA: Diagnosis not present

## 2021-12-02 DIAGNOSIS — S060X1A Concussion with loss of consciousness of 30 minutes or less, initial encounter: Secondary | ICD-10-CM | POA: Diagnosis not present

## 2021-12-02 MED ORDER — ONDANSETRON 4 MG PO TBDP
8.0000 mg | ORAL_TABLET | Freq: Once | ORAL | Status: AC
  Administered 2021-12-02: 8 mg via ORAL
  Filled 2021-12-02: qty 2

## 2021-12-02 MED ORDER — ONDANSETRON 8 MG PO TBDP: 8.0000 mg | ORAL_TABLET | Freq: Three times a day (TID) | ORAL | 0 refills | Status: AC | PRN

## 2021-12-02 MED ORDER — NAPROXEN 375 MG PO TABS: 375.0000 mg | ORAL_TABLET | Freq: Two times a day (BID) | ORAL | 0 refills | Status: AC

## 2021-12-02 MED ORDER — ACETAMINOPHEN 500 MG PO TABS
1000.0000 mg | ORAL_TABLET | Freq: Once | ORAL | Status: AC
  Administered 2021-12-02: 1000 mg via ORAL
  Filled 2021-12-02: qty 2

## 2021-12-02 NOTE — Discharge Instructions (Addendum)
The CT scans did not show any signs of serious injuries.  Your symptoms should improve over the next several days.  Take the medications prescribed for headache and nausea.  Return to the ED for trouble with fevers worsening symptoms
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
## 2021-12-02 NOTE — ED Triage Notes (Signed)
Pt reports HA pain over entire head since 2am. Has not had a HA like this before. Cannot identify anything that seems to make pain worse or better. No emesis

## 2021-12-02 NOTE — ED Notes (Signed)
ED Provider at bedside. 

## 2022-04-03 ENCOUNTER — Emergency Department (HOSPITAL_COMMUNITY)
Admission: EM | Admit: 2022-04-03 | Discharge: 2022-04-03 | Discharge status: Against Medical Advice | Payer: 59 | Attending: Emergency Medicine | Admitting: Emergency Medicine

## 2022-04-03 ENCOUNTER — Other Ambulatory Visit: Payer: Self-pay

## 2022-04-03 DIAGNOSIS — Z5321 Procedure and treatment not carried out due to patient leaving prior to being seen by health care provider: Secondary | ICD-10-CM | POA: Insufficient documentation

## 2022-04-03 NOTE — ED Notes (Signed)
No response x2 for triage

## 2022-07-12 DIAGNOSIS — Z3009 Encounter for other general counseling and advice on contraception: Secondary | ICD-10-CM | POA: Diagnosis not present

## 2022-07-12 DIAGNOSIS — Z1151 Encounter for screening for human papillomavirus (HPV): Secondary | ICD-10-CM | POA: Diagnosis not present

## 2022-07-12 DIAGNOSIS — Z113 Encounter for screening for infections with a predominantly sexual mode of transmission: Secondary | ICD-10-CM | POA: Diagnosis not present

## 2022-07-12 DIAGNOSIS — Z124 Encounter for screening for malignant neoplasm of cervix: Secondary | ICD-10-CM | POA: Diagnosis not present

## 2022-07-12 DIAGNOSIS — Z1389 Encounter for screening for other disorder: Secondary | ICD-10-CM | POA: Diagnosis not present

## 2022-07-12 DIAGNOSIS — Z01419 Encounter for gynecological examination (general) (routine) without abnormal findings: Secondary | ICD-10-CM | POA: Diagnosis not present

## 2022-12-16 DIAGNOSIS — F900 Attention-deficit hyperactivity disorder, predominantly inattentive type: Secondary | ICD-10-CM | POA: Diagnosis not present

## 2022-12-16 DIAGNOSIS — F4010 Social phobia, unspecified: Secondary | ICD-10-CM | POA: Diagnosis not present

## 2022-12-18 ENCOUNTER — Other Ambulatory Visit (HOSPITAL_COMMUNITY): Payer: Self-pay

## 2022-12-18 MED ORDER — LISDEXAMFETAMINE DIMESYLATE 50 MG PO CAPS
50.0000 mg | ORAL_CAPSULE | Freq: Every morning | ORAL | 0 refills | Status: DC
  Filled 2022-12-18: qty 30, 30d supply, fill #0

## 2022-12-18 MED ORDER — VILAZODONE HCL 10 MG PO TABS
10.0000 mg | ORAL_TABLET | Freq: Every day | ORAL | 5 refills | Status: AC
  Filled 2022-12-18: qty 30, 30d supply, fill #0

## 2022-12-20 ENCOUNTER — Other Ambulatory Visit (HOSPITAL_COMMUNITY): Payer: Self-pay

## 2022-12-23 ENCOUNTER — Other Ambulatory Visit (HOSPITAL_COMMUNITY): Payer: Self-pay

## 2022-12-27 ENCOUNTER — Other Ambulatory Visit (HOSPITAL_COMMUNITY): Payer: Self-pay

## 2023-01-01 ENCOUNTER — Other Ambulatory Visit (HOSPITAL_COMMUNITY): Payer: Self-pay

## 2023-01-28 DIAGNOSIS — F900 Attention-deficit hyperactivity disorder, predominantly inattentive type: Secondary | ICD-10-CM | POA: Diagnosis not present

## 2023-01-28 DIAGNOSIS — F4010 Social phobia, unspecified: Secondary | ICD-10-CM | POA: Diagnosis not present

## 2023-01-29 ENCOUNTER — Other Ambulatory Visit (HOSPITAL_COMMUNITY): Payer: Self-pay

## 2023-01-29 MED ORDER — LISDEXAMFETAMINE DIMESYLATE 50 MG PO CAPS
50.0000 mg | ORAL_CAPSULE | Freq: Every morning | ORAL | 0 refills | Status: AC
  Filled 2023-01-29: qty 30, 30d supply, fill #0

## 2023-02-11 ENCOUNTER — Other Ambulatory Visit (HOSPITAL_COMMUNITY): Payer: Self-pay

## 2024-06-17 ENCOUNTER — Ambulatory Visit: Payer: Self-pay | Admitting: Nurse Practitioner
